# Patient Record
Sex: Male | Born: 2000 | Race: Black or African American | Hispanic: No | Marital: Single | Smoking: Former smoker
Health system: Southern US, Community
[De-identification: ages and names within clinical notes are randomized; demographics above are authoritative.]

## PROBLEM LIST (undated history)

## (undated) DIAGNOSIS — F909 Attention-deficit hyperactivity disorder, unspecified type: Secondary | ICD-10-CM

## (undated) DIAGNOSIS — F209 Schizophrenia, unspecified: Secondary | ICD-10-CM

---
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
## 2019-03-23 ENCOUNTER — Emergency Department (HOSPITAL_COMMUNITY)
Admission: EM | Admit: 2019-03-23 | Discharge: 2019-03-23 | Disposition: A | Discharge status: Home / Self Care | Payer: Self-pay | Attending: Emergency Medicine | Admitting: Emergency Medicine

## 2019-03-23 ENCOUNTER — Other Ambulatory Visit: Payer: Self-pay

## 2019-03-23 ENCOUNTER — Encounter (HOSPITAL_COMMUNITY): Payer: Self-pay | Admitting: Emergency Medicine

## 2019-03-23 DIAGNOSIS — J301 Allergic rhinitis due to pollen: Secondary | ICD-10-CM | POA: Insufficient documentation

## 2019-03-23 DIAGNOSIS — F17200 Nicotine dependence, unspecified, uncomplicated: Secondary | ICD-10-CM | POA: Insufficient documentation

## 2019-03-23 DIAGNOSIS — K120 Recurrent oral aphthae: Secondary | ICD-10-CM | POA: Insufficient documentation

## 2019-03-23 DIAGNOSIS — J302 Other seasonal allergic rhinitis: Secondary | ICD-10-CM

## 2019-03-23 DIAGNOSIS — Z79899 Other long term (current) drug therapy: Secondary | ICD-10-CM | POA: Insufficient documentation

## 2019-03-23 MED ORDER — CETIRIZINE-PSEUDOEPHEDRINE ER 5-120 MG PO TB12: 1.0000 | ORAL_TABLET | Freq: Every day | ORAL | 0 refills | Status: DC

## 2019-03-23 MED ORDER — LIDOCAINE VISCOUS HCL 2 % MT SOLN: OROMUCOSAL | 0 refills | Status: DC

## 2019-03-23 MED ORDER — FLUTICASONE PROPIONATE 50 MCG/ACT NA SUSP: 1.0000 | Freq: Every day | NASAL | 2 refills | Status: DC | PRN

## 2019-03-23 NOTE — ED Triage Notes (Signed)
Pt c/o of nasal congestion, chest congestion, productive cough and right sided tooth pain x 3 days

## 2019-03-23 NOTE — Discharge Instructions (Signed)
You may use the medications prescribed for your symptoms.  You may find that the Zyrtec-D alone alleviates your nasal congestion and drainage, but you may add the Flonase if needed for additional help with nasal congestion.  You have some small ulcers of your gums called canker sores.  These can be painful especially if you eat salty or spicy or acidic foods.  You may use the topical numbing medicine prescribed for symptom relief while these sores heal.

## 2019-03-24 NOTE — ED Provider Notes (Signed)
[facility] EMERGENCY DEPARTMENT Provider Note   CSN: [id number] Arrival date & time: 03/23/19  0964    History   Chief Complaint Chief Complaint  Patient presents with  . Nasal Congestion    HPI Omar Owen is a 18 y.o. male with no significant past medical history presenting with 2 complaints, the first being nasal congestion with clear rhinorrhea, chest congestion with cough productive of a clear sputum x 3 days.  He also reports itchy, watery eyes and episodic sneezes.  He denies fever, headache, ear or sinus pain, neck pain or stiffness, also no sob, cp or sore throat.  He also reports pain along his right lower gumline and has several sores in his mouth which sting when he eats.  He has had no treatment for his sx.  He is supposed to see his dentist this week for evaluation of molar teeth along his right lower jaw that never erupted.     The history is provided by the patient.    Past Medical History:  Diagnosis Date  . ADHD     There are no active problems to display for this patient.   History reviewed. No pertinent surgical history.      Home Medications    Prior to Admission medications   Medication Sig Start Date End Date Taking? Authorizing Provider  benzonatate (TESSALON) 100 MG capsule Take 1 capsule (100 mg total) by mouth 3 (three) times daily as needed for cough. 11/14/16   Samuel Jester, DO  cetirizine-pseudoephedrine (ZYRTEC-D) 5-120 MG tablet Take 1 tablet by mouth daily. 03/23/19   Burgess Amor, PA-C  fluticasone (FLONASE) 50 MCG/ACT nasal spray Place 1 spray into both nostrils daily as needed for allergies or rhinitis. 03/23/19   Burgess Amor, PA-C  lidocaine (XYLOCAINE) 2 % solution Apply a Q-tip swab dipped in your lidocaine the areas of ulcerations and pain in your mouth every 3 hours as needed for pain relief. 03/23/19   Burgess Amor, PA-C  ondansetron (ZOFRAN) 4 MG tablet Take 1 tablet (4 mg total) by mouth every 8 (eight) hours as needed for  nausea or vomiting. 11/14/16   Samuel Jester, DO  OVER THE COUNTER MEDICATION Take 10 mLs by mouth daily as needed (cough). OTC cough medication    [provider]    Family History History reviewed. No pertinent family history.  Social History Social History   Tobacco Use  . Smoking status: Current Every Day Smoker    Packs/day: 0.50  . Smokeless tobacco: Never Used  Substance Use Topics  . Alcohol use: No  . Drug use: No     Allergies   Patient has no known allergies.   Review of Systems Review of Systems  Constitutional: Negative for chills and fever.  HENT: Positive for congestion, dental problem and rhinorrhea. Negative for ear pain, facial swelling, sinus pressure, sore throat, trouble swallowing and voice change.   Eyes: Negative for discharge.  Respiratory: Positive for cough. Negative for shortness of breath, wheezing and stridor.   Cardiovascular: Negative for chest pain.  Gastrointestinal: Negative for abdominal pain.  Genitourinary: Negative.   Musculoskeletal: Negative for neck pain and neck stiffness.  Neurological: Negative for speech difficulty and headaches.     Physical Exam Updated Vital Signs BP 104/67   Pulse (!) 48   Temp 98.5 F (36.9 C) (Oral)   Resp 16   Ht 6\' 1"  (1.854 m)   Wt 63.5 kg   SpO2 100%   BMI 18.47 kg/m  Physical Exam Constitutional:      General: He is not in acute distress.    Appearance: He is well-developed.  HENT:     Head: Normocephalic and atraumatic.     Jaw: No trismus or swelling.     Right Ear: Tympanic membrane, ear canal and external ear normal.     Left Ear: Tympanic membrane, ear canal and external ear normal.     Nose: Mucosal edema and rhinorrhea present.     Comments: Nasal mucosa pale and boggy appearing.     Mouth/Throat:     Mouth: Oral lesions present.     Dentition: Normal dentition. No dental tenderness, dental caries or dental abscesses.     Pharynx: Uvula midline. No  oropharyngeal exudate or posterior oropharyngeal erythema.     Tonsils: No tonsillar abscesses.     Comments: Aphthous ulceration along right lower buccal surface gingiva.  2 very small ulcers also on sublingual mucosa.  Sublingual space soft.  Dentition appears healthy. Eyes:     Conjunctiva/sclera:     Right eye: Right conjunctiva is injected.     Left eye: Left conjunctiva is injected.     Comments: Mild conjunctival injection.  Neck:     Musculoskeletal: Normal range of motion and neck supple.  Cardiovascular:     Rate and Rhythm: Normal rate.     Heart sounds: Normal heart sounds.  Pulmonary:     Effort: Pulmonary effort is normal. No respiratory distress.     Breath sounds: Normal breath sounds. No wheezing, rhonchi or rales.  Abdominal:     General: There is no distension.     Palpations: Abdomen is soft.     Tenderness: There is no abdominal tenderness.  Musculoskeletal: Normal range of motion.  Lymphadenopathy:     Cervical: No cervical adenopathy.  Skin:    General: Skin is warm and dry.     Findings: No erythema or rash.  Neurological:     Mental Status: He is alert and oriented to person, place, and time.      ED Treatments / Results  Labs (all labs ordered are listed, but only abnormal results are displayed) Labs Reviewed - No data to display  EKG None  Radiology No results found.  Procedures Procedures (including critical care time)  Medications Ordered in ED Medications - No data to display   Initial Impression / Assessment and Plan / ED Course  I have reviewed the triage vital signs and the nursing notes.  Pertinent labs & imaging results that were available during my care of the patient were reviewed by me and considered in my medical decision making (see chart for details).        Pt with nasal congestion, cough, itchy eyes, exam suggesting seasonal allergy. Sx tx including zyrtec d, flonase.  Normal dentition with oral apthous ulcerations  - lidocaine topical for sx relief. Advised f/u pcp prn for persistent sx.   Final Clinical Impressions(s) / ED Diagnoses   Final diagnoses:  Seasonal allergic rhinitis, unspecified trigger  Aphthous ulcer    ED Discharge Orders         Ordered    cetirizine-pseudoephedrine (ZYRTEC-D) 5-120 MG tablet  Daily     03/23/19 1109    fluticasone (FLONASE) 50 MCG/ACT nasal spray  Daily PRN     03/23/19 1109    lidocaine (XYLOCAINE) 2 % solution     03/23/19 1112           Nithin Demeo, [place], PA-C  03/24/19 2121    Vanetta Mulders, MD 03/26/19 1929

## 2019-12-29 ENCOUNTER — Other Ambulatory Visit: Payer: Self-pay

## 2019-12-29 ENCOUNTER — Ambulatory Visit: Payer: Self-pay | Attending: Internal Medicine

## 2019-12-29 DIAGNOSIS — Z20822 Contact with and (suspected) exposure to covid-19: Secondary | ICD-10-CM

## 2019-12-29 DIAGNOSIS — U071 COVID-19: Secondary | ICD-10-CM | POA: Insufficient documentation

## 2019-12-30 LAB — NOVEL CORONAVIRUS, NAA: SARS-CoV-2, NAA: DETECTED — AB

## 2020-01-12 ENCOUNTER — Ambulatory Visit: Payer: Self-pay | Attending: Internal Medicine

## 2020-01-13 ENCOUNTER — Other Ambulatory Visit: Payer: Self-pay

## 2020-01-13 ENCOUNTER — Ambulatory Visit: Payer: Self-pay | Attending: Internal Medicine

## 2020-01-13 DIAGNOSIS — Z20822 Contact with and (suspected) exposure to covid-19: Secondary | ICD-10-CM | POA: Insufficient documentation

## 2020-01-14 LAB — NOVEL CORONAVIRUS, NAA: SARS-CoV-2, NAA: NOT DETECTED

## 2020-01-18 ENCOUNTER — Telehealth: Payer: Self-pay | Admitting: General Practice

## 2020-01-18 NOTE — Telephone Encounter (Signed)
Negative COVID results given. Patient results "NOT Detected." Caller expressed understanding. ° °

## 2020-01-20 ENCOUNTER — Telehealth: Payer: Self-pay | Admitting: Orthopedic Surgery
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
## 2020-01-24 NOTE — Telephone Encounter (Signed)
Called back to patient to follow up, as no further response received. Left another message.

## 2021-08-30 ENCOUNTER — Emergency Department (HOSPITAL_COMMUNITY)
Admission: EM | Admit: 2021-08-30 | Discharge: 2021-08-30 | Disposition: A | Discharge status: Home / Self Care | Payer: PRIVATE HEALTH INSURANCE | Attending: Emergency Medicine | Admitting: Emergency Medicine

## 2021-08-30 ENCOUNTER — Encounter (HOSPITAL_COMMUNITY): Payer: Self-pay | Admitting: Emergency Medicine

## 2021-08-30 ENCOUNTER — Other Ambulatory Visit: Payer: Self-pay

## 2021-08-30 DIAGNOSIS — F17210 Nicotine dependence, cigarettes, uncomplicated: Secondary | ICD-10-CM | POA: Insufficient documentation

## 2021-08-30 DIAGNOSIS — K0889 Other specified disorders of teeth and supporting structures: Secondary | ICD-10-CM | POA: Diagnosis not present

## 2021-08-30 DIAGNOSIS — K029 Dental caries, unspecified: Secondary | ICD-10-CM

## 2021-08-30 MED ORDER — TRAMADOL HCL 50 MG PO TABS: 50.0000 mg | ORAL_TABLET | Freq: Four times a day (QID) | ORAL | 0 refills | Status: DC | PRN

## 2021-08-30 MED ORDER — CLINDAMYCIN HCL 300 MG PO CAPS: 300.0000 mg | ORAL_CAPSULE | Freq: Four times a day (QID) | ORAL | 0 refills | Status: DC

## 2021-08-30 NOTE — Discharge Instructions (Addendum)
Begin taking clindamycin as prescribed.  Begin taking tramadol as prescribed as needed for pain.  Follow-up with your dentist in the next 3 to 4 days.

## 2021-08-30 NOTE — ED Triage Notes (Signed)
Pt c/o top right dental pain for the past 3 weeks. Pt had filling fall out, pt has attempted to get in with dentist but has been unable to.

## 2021-08-30 NOTE — ED Provider Notes (Signed)
[facility] EMERGENCY DEPARTMENT Provider Note   CSN: [id number] Arrival date & time: 08/30/21  0001     History Chief Complaint  Patient presents with   Dental Pain    Omar Owen is a 20 y.o. male.  Patient is a 20 year old male with history of ADHD.  He presents with dental pain.  He describes a filling following out of his right upper bicuspid.  He is describing sharp pain ever since.  The pain is worse with eating or drinking.  The history is provided by the patient.  Dental Pain Location:  Upper Upper teeth location:  6/RU cuspid Quality:  Throbbing Severity:  Moderate Onset quality:  Sudden Duration:  3 days Timing:  Constant Progression:  Worsening Chronicity:  New Context: filling fell out   Relieved by:  Nothing Worsened by:  Cold food/drink and hot food/drink     Past Medical History:  Diagnosis Date   ADHD     There are no problems to display for this patient.   History reviewed. No pertinent surgical history.     History reviewed. No pertinent family history.  Social History   Tobacco Use   Smoking status: Every Day    Packs/day: 0.50    Types: Cigarettes   Smokeless tobacco: Never  Vaping Use   Vaping Use: Some days  Substance Use Topics   Alcohol use: No   Drug use: No    Home Medications Prior to Admission medications   Medication Sig Start Date End Date Taking? Authorizing Provider  benzonatate (TESSALON) 100 MG capsule Take 1 capsule (100 mg total) by mouth 3 (three) times daily as needed for cough. 11/14/16   Samuel Jester, DO  cetirizine-pseudoephedrine (ZYRTEC-D) 5-120 MG tablet Take 1 tablet by mouth daily. 03/23/19   Burgess Amor, PA-C  fluticasone (FLONASE) 50 MCG/ACT nasal spray Place 1 spray into both nostrils daily as needed for allergies or rhinitis. 03/23/19   Burgess Amor, PA-C  lidocaine (XYLOCAINE) 2 % solution Apply a Q-tip swab dipped in your lidocaine the areas of ulcerations and pain in your mouth every 3 hours  as needed for pain relief. 03/23/19   Burgess Amor, PA-C  ondansetron (ZOFRAN) 4 MG tablet Take 1 tablet (4 mg total) by mouth every 8 (eight) hours as needed for nausea or vomiting. 11/14/16   Samuel Jester, DO  OVER THE COUNTER MEDICATION Take 10 mLs by mouth daily as needed (cough). OTC cough medication    [provider]    Allergies    Patient has no known allergies.  Review of Systems   Review of Systems  All other systems reviewed and are negative.  Physical Exam Updated Vital Signs BP 112/71 (BP Location: Right Arm)   Pulse 67   Temp 98 F (36.7 C) (Oral)   Resp 17   Ht 6\' 1"  (1.854 m)   Wt 63.5 kg   SpO2 100%   BMI 18.47 kg/m   Physical Exam Vitals and nursing note reviewed.  Constitutional:      General: He is not in acute distress.    Appearance: Normal appearance. He is not ill-appearing.  HENT:     Head: Normocephalic and atraumatic.     Mouth/Throat:     Comments: The right upper bicuspid have decay.  There is surrounding gingival inflammation, but no frank abscess. Pulmonary:     Effort: Pulmonary effort is normal.  Skin:    General: Skin is warm and dry.  Neurological:  Mental Status: He is alert.    ED Results / Procedures / Treatments   Labs (all labs ordered are listed, but only abnormal results are displayed) Labs Reviewed - No data to display  EKG None  Radiology No results found.  Procedures Procedures   Medications Ordered in ED Medications - No data to display  ED Course  I have reviewed the triage vital signs and the nursing notes.  Pertinent labs & imaging results that were available during my care of the patient were reviewed by me and considered in my medical decision making (see chart for details).    MDM Rules/Calculators/A&P  Patient with dental pain after a filling fell out.  He will be treated with antibiotics, tramadol, and follow-up with dentistry.  Final Clinical Impression(s) / ED Diagnoses Final  diagnoses:  None    Rx / DC Orders ED Discharge Orders     None        Geoffery Lyons, MD 08/30/21 [phone]

## 2023-06-06 ENCOUNTER — Emergency Department (HOSPITAL_COMMUNITY)
Admission: EM | Admit: 2023-06-06 | Discharge: 2023-06-07 | Disposition: A | Discharge status: Home / Self Care | Payer: PRIVATE HEALTH INSURANCE | Attending: Emergency Medicine | Admitting: Emergency Medicine

## 2023-06-06 ENCOUNTER — Other Ambulatory Visit: Payer: Self-pay

## 2023-06-06 DIAGNOSIS — F17210 Nicotine dependence, cigarettes, uncomplicated: Secondary | ICD-10-CM | POA: Insufficient documentation

## 2023-06-06 DIAGNOSIS — F29 Unspecified psychosis not due to a substance or known physiological condition: Secondary | ICD-10-CM | POA: Diagnosis present

## 2023-06-06 LAB — RAPID URINE DRUG SCREEN, HOSP PERFORMED
Amphetamines: NOT DETECTED
Barbiturates: NOT DETECTED
Benzodiazepines: NOT DETECTED
Cocaine: NOT DETECTED
Opiates: NOT DETECTED
Tetrahydrocannabinol: POSITIVE — AB

## 2023-06-06 LAB — CBC
HCT: 40.3 % (ref 39.0–52.0)
Hemoglobin: 14 g/dL (ref 13.0–17.0)
MCH: 30.7 pg (ref 26.0–34.0)
MCHC: 34.7 g/dL (ref 30.0–36.0)
MCV: 88.4 fL (ref 80.0–100.0)
Platelets: 242 10*3/uL (ref 150–400)
RBC: 4.56 MIL/uL (ref 4.22–5.81)
RDW: 12.9 % (ref 11.5–15.5)
WBC: 11.1 10*3/uL — ABNORMAL HIGH (ref 4.0–10.5)
nRBC: 0 % (ref 0.0–0.2)

## 2023-06-06 LAB — COMPREHENSIVE METABOLIC PANEL
ALT: 13 U/L (ref 0–44)
AST: 18 U/L (ref 15–41)
Albumin: 4.2 g/dL (ref 3.5–5.0)
Alkaline Phosphatase: 50 U/L (ref 38–126)
Anion gap: 10 (ref 5–15)
BUN: 9 mg/dL (ref 6–20)
CO2: 25 mmol/L (ref 22–32)
Calcium: 9 mg/dL (ref 8.9–10.3)
Chloride: 102 mmol/L (ref 98–111)
Creatinine, Ser: 0.93 mg/dL (ref 0.61–1.24)
GFR, Estimated: >60 mL/min (ref >60)
Glucose, Bld: 97 mg/dL (ref 70–99)
Potassium: 3.9 mmol/L (ref 3.5–5.1)
Sodium: 137 mmol/L (ref 135–145)
Total Bilirubin: 0.9 mg/dL (ref 0.3–1.2)
Total Protein: 7 g/dL (ref 6.5–8.1)

## 2023-06-06 LAB — ETHANOL: Alcohol, Ethyl (B): <10 mg/dL (ref <10)

## 2023-06-06 NOTE — ED Provider Notes (Signed)
11:41 PM Assumed care from Dr. Rubin Payor, please see their note for full history, physical and decision making until this point. In brief this is a 22 y.o. year old male who presented to the ED tonight with IVC     Here with possible new onset psychosis. Pending medical clearance for TTS consultation. Will need lab FU.   Labs reassuring. TTS consult ordered.   Labs, studies and imaging reviewed by myself and considered in medical decision making if ordered. Imaging interpreted by radiology.  Labs Reviewed  CBC - Abnormal; Notable for the following components:      Result Value   WBC 11.1 (*)    All other components within normal limits  RAPID URINE DRUG SCREEN, HOSP PERFORMED - Abnormal; Notable for the following components:   Tetrahydrocannabinol POSITIVE (*)    All other components within normal limits  COMPREHENSIVE METABOLIC PANEL  ETHANOL    No orders to display    No follow-ups on file.    Marily Memos, MD 06/06/23 [phone]

## 2023-06-06 NOTE — ED Notes (Signed)
Pt has been dressed out and put in scrubs. Pts belongings are placed in locker 10. Security has wanded pt. Gave a sandwich bag and apple juice.
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
## 2023-06-07 ENCOUNTER — Inpatient Hospital Stay (HOSPITAL_COMMUNITY)
Admission: EM | Admit: 2023-06-07 | Discharge: 2023-06-12 | DRG: 897 | Disposition: A | Discharge status: Home / Self Care | Payer: PRIVATE HEALTH INSURANCE | Source: Intra-hospital | Attending: Psychiatry | Admitting: Psychiatry

## 2023-06-07 ENCOUNTER — Encounter (HOSPITAL_COMMUNITY): Payer: Self-pay | Admitting: Psychiatry

## 2023-06-07 DIAGNOSIS — Z818 Family history of other mental and behavioral disorders: Secondary | ICD-10-CM

## 2023-06-07 DIAGNOSIS — R45851 Suicidal ideations: Secondary | ICD-10-CM | POA: Diagnosis present

## 2023-06-07 DIAGNOSIS — F19959 Other psychoactive substance use, unspecified with psychoactive substance-induced psychotic disorder, unspecified: Principal | ICD-10-CM | POA: Diagnosis present

## 2023-06-07 DIAGNOSIS — F4010 Social phobia, unspecified: Secondary | ICD-10-CM | POA: Diagnosis present

## 2023-06-07 DIAGNOSIS — F32A Depression, unspecified: Secondary | ICD-10-CM | POA: Diagnosis present

## 2023-06-07 DIAGNOSIS — F17210 Nicotine dependence, cigarettes, uncomplicated: Secondary | ICD-10-CM | POA: Diagnosis present

## 2023-06-07 DIAGNOSIS — F29 Unspecified psychosis not due to a substance or known physiological condition: Secondary | ICD-10-CM

## 2023-06-07 MED ORDER — LORAZEPAM 1 MG PO TABS: 2.0000 mg | ORAL_TABLET | Freq: Three times a day (TID) | ORAL | Status: DC | PRN

## 2023-06-07 MED ORDER — HYDROXYZINE HCL 25 MG PO TABS: 50.0000 mg | ORAL_TABLET | Freq: Three times a day (TID) | ORAL | Status: DC | PRN

## 2023-06-07 MED ORDER — MAGNESIUM HYDROXIDE 400 MG/5ML PO SUSP: 30.0000 mL | Freq: Every day | ORAL | Status: DC | PRN

## 2023-06-07 MED ORDER — OLANZAPINE 10 MG IM SOLR: 10.0000 mg | Freq: Once | INTRAMUSCULAR | Status: DC | PRN

## 2023-06-07 MED ORDER — HALOPERIDOL 5 MG PO TABS: 5.0000 mg | ORAL_TABLET | Freq: Three times a day (TID) | ORAL | Status: DC | PRN

## 2023-06-07 MED ORDER — DIPHENHYDRAMINE HCL 50 MG/ML IJ SOLN
50.0000 mg | Freq: Three times a day (TID) | INTRAMUSCULAR | Status: DC | PRN
  Administered 2023-06-07: 50 mg via INTRAMUSCULAR
  Filled 2023-06-07: qty 1

## 2023-06-07 MED ORDER — OLANZAPINE 5 MG PO TBDP: 5.0000 mg | ORAL_TABLET | Freq: Three times a day (TID) | ORAL | Status: DC | PRN

## 2023-06-07 MED ORDER — DIPHENHYDRAMINE HCL 25 MG PO CAPS: 50.0000 mg | ORAL_CAPSULE | Freq: Three times a day (TID) | ORAL | Status: DC | PRN

## 2023-06-07 MED ORDER — LORAZEPAM 2 MG/ML IJ SOLN
2.0000 mg | Freq: Three times a day (TID) | INTRAMUSCULAR | Status: DC | PRN
  Administered 2023-06-07: 2 mg via INTRAMUSCULAR
  Filled 2023-06-07: qty 1

## 2023-06-07 MED ORDER — HALOPERIDOL LACTATE 5 MG/ML IJ SOLN
5.0000 mg | Freq: Three times a day (TID) | INTRAMUSCULAR | Status: DC | PRN
  Administered 2023-06-07: 5 mg via INTRAMUSCULAR
  Filled 2023-06-07: qty 1

## 2023-06-07 MED ORDER — ALUM & MAG HYDROXIDE-SIMETH 200-200-20 MG/5ML PO SUSP: 30.0000 mL | ORAL | Status: DC | PRN

## 2023-06-07 MED ORDER — OLANZAPINE 10 MG PO TBDP
10.0000 mg | ORAL_TABLET | Freq: Every day | ORAL | Status: DC
  Administered 2023-06-08 (×2): 10 mg via ORAL
  Filled 2023-06-07 (×4): qty 1

## 2023-06-07 MED ORDER — RISPERIDONE 1 MG PO TBDP: 1.0000 mg | ORAL_TABLET | Freq: Two times a day (BID) | ORAL | Status: DC

## 2023-06-07 MED ORDER — HYDROXYZINE HCL 25 MG PO TABS
25.0000 mg | ORAL_TABLET | Freq: Three times a day (TID) | ORAL | Status: DC | PRN
  Filled 2023-06-07: qty 1

## 2023-06-07 MED ORDER — ACETAMINOPHEN 325 MG PO TABS: 650.0000 mg | ORAL_TABLET | Freq: Four times a day (QID) | ORAL | Status: DC | PRN

## 2023-06-07 MED ORDER — TRAZODONE HCL 50 MG PO TABS
50.0000 mg | ORAL_TABLET | Freq: Every evening | ORAL | Status: DC | PRN
  Administered 2023-06-08 – 2023-06-09 (×2): 50 mg via ORAL
  Filled 2023-06-07 (×3): qty 1
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
## 2023-06-07 NOTE — ED Provider Notes (Addendum)
Discussed with telepsych. Suspects new onset psychosis and recommending inpatient. Bed search initiated.    Shayonna Ocampo, Barbara Cower, MD 06/07/23 [phone]  Patient accepted to [facility]. [place] accepting.    Jadee Golebiewski, Barbara Cower, MD 06/07/23 [phone]
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
## 2023-06-07 NOTE — Tx Team (Signed)
Initial Treatment Plan 06/07/2023 12:22 PM Omar Owen ZOX:[id number]    PATIENT STRESSORS: Legal issue   Marital or family conflict   Substance abuse     PATIENT STRENGTHS: Physical Health  Work skills    PATIENT IDENTIFIED PROBLEMS: "Get out of here"  Angry  Ineffective coping skills  Anxiety               DISCHARGE CRITERIA:  Ability to meet basic life and health needs Adequate post-discharge living arrangements  PRELIMINARY DISCHARGE PLAN: Attend aftercare/continuing care group Outpatient therapy Return to previous living arrangement  PATIENT/FAMILY INVOLVEMENT: This treatment plan has been presented to and reviewed with the patient, Omar Owen, and/or family member.  The patient and family have been given the opportunity to ask questions and make suggestions.  Clarene Critchley, RN 06/07/2023, 12:22 PM

## 2023-06-07 NOTE — Group Note (Signed)
Date:  06/07/2023 Time:  8:39 PM  Group Topic/Focus:  Wrap-Up Group:   The focus of this group is to help patients review their daily goal of treatment and discuss progress on daily workbooks.    Participation Level:  Did Not Attend   Scot Dock 06/07/2023, 8:39 PM
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
## 2023-06-07 NOTE — ED Provider Notes (Signed)
Emergency Medicine Observation Re-evaluation Note  Omar Owen is a 22 y.o. male, seen on rounds today.  Pt initially presented to the ED for complaints of IVC Currently, the patient is sleeping, easily arousable.  Physical Exam  BP 102/66 (BP Location: Right Arm)   Pulse 94   Temp 98.2 F (36.8 C) (Oral)   Resp 18   Ht 6\' 1"  (1.854 m)   Wt 63.5 kg   SpO2 96%   BMI 18.47 kg/m  Physical Exam General: alert after waking up Cardiac: no m/r/g Lungs: clear Psych: paranoid, delusional  ED Course / MDM  EKG:EKG Interpretation  Date/Time:  Friday June 06 2023 22:15:51 EDT Ventricular Rate:  94 PR Interval:  152 QRS Duration: 90 QT Interval:  332 QTC Calculation: 415 R Axis:   70 Text Interpretation: Normal sinus rhythm Right atrial enlargement Borderline ECG No previous ECGs available Confirmed by Marily Memos [id number]) on 06/06/2023 11:44:21 PM  I have reviewed the labs performed to date as well as medications administered while in observation.  Recent changes in the last 24 hours include accepted to [facility], [place], pending transport.  Plan  Current plan is for accepted to [facility], [place], pending transport.Marily Memos, MD 06/07/23 [phone]

## 2023-06-07 NOTE — Progress Notes (Signed)
   06/07/23 1945  Psych Admission Type (Psych Patients Only)  Admission Status Involuntary  Psychosocial Assessment  Patient Complaints Anger;Irritability  Eye Contact Brief  Facial Expression Angry  Affect Labile  Speech Soft  Interaction Minimal  Motor Activity Slow  Appearance/Hygiene In scrubs  Behavior Characteristics Agitated  Mood Angry  Aggressive Behavior  Effect No apparent injury  Thought Process  Coherency Circumstantial  Content Blaming others  Delusions None reported or observed  Perception WDL  Hallucination None reported or observed  Judgment Impaired  Confusion None  Danger to Self  Current suicidal ideation? Denies  Danger to Others  Danger to Others Reported or observed  Danger to Others Abnormal  Harmful Behavior to others Threats of violence towards other people observed or expressed   Destructive Behavior No threats or harm toward property

## 2023-06-07 NOTE — ED Notes (Signed)
Patient escorted with Omar Owen Dept to [facility], all of patient's belonging sent with him.

## 2023-06-07 NOTE — Progress Notes (Signed)
Pt became verbally and physically aggressive in milieu in his protest, demands for discharge when educated on IVC commitment by Clinical research associate. Per pt "it's a fucking woman's thing, My family lied on me, everybody playing games with me. I don't fucking belong here and I need to leave". Escalated to slamming his room door, throwing his hygiene products (shampoo, lotion, etc) aggressively from his room door to unit entrance door. Verbal redirections were ineffective at the time as pt continued to escalate in his aggression. PRN agitation protocol (See EMAR) administered at 1248. Pt asleep at 1345 when reassessed. Respirations noted and unlabored. Safety checks maintained at Q 15 minutes intervals.

## 2023-06-07 NOTE — ED Notes (Signed)
TTS completed. 

## 2023-06-07 NOTE — ED Notes (Addendum)
TTS evaluation set for 0415.
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
## 2023-06-07 NOTE — BH Assessment (Signed)
Dr. Gilman Schmidt will see pt at 4:15 am.

## 2023-06-08 MED ORDER — NICOTINE POLACRILEX 2 MG MT GUM
2.0000 mg | CHEWING_GUM | OROMUCOSAL | Status: DC | PRN
  Administered 2023-06-08 – 2023-06-12 (×16): 2 mg via ORAL
  Filled 2023-06-08 (×6): qty 1

## 2023-06-08 NOTE — Plan of Care (Signed)
  Problem: Education: Goal: Emotional status will improve Outcome: Progressing Goal: Mental status will improve Outcome: Progressing   Problem: Health Behavior/Discharge Planning: Goal: Compliance with treatment plan for underlying cause of condition will improve Outcome: Progressing   

## 2023-06-08 NOTE — Group Note (Signed)
Date:  06/08/2023 Time:  8:54 PM  Group Topic/Focus:  Wrap-Up Group:   The focus of this group is to help patients review their daily goal of treatment and discuss progress on daily workbooks.    Participation Level:  Active  Participation Quality:  Appropriate  Affect:  Appropriate  Cognitive:  Appropriate  Insight: Appropriate  Engagement in Group:  Engaged  Modes of Intervention:  Education and Exploration  Additional Comments:  Patient attended and participated in group tonight. He report that the most significant thing that happen today for him was he woke up and he is at peace with himself.  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  Lita Mains [facility] 06/08/2023, 8:54 PM

## 2023-06-08 NOTE — Group Note (Signed)
Date:  06/08/2023 Time:  9:45 AM  Group Topic/Focus:  Goals Group:   The focus of this group is to help patients establish daily goals to achieve during treatment and discuss how the patient can incorporate goal setting into their daily lives to aide in recovery.    Participation Level:  Did Not Attend    Donell Beers 06/08/2023, 9:45 AM
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
## 2023-06-08 NOTE — Progress Notes (Signed)
   06/08/23 1935  Psych Admission Type (Psych Patients Only)  Admission Status Involuntary  Psychosocial Assessment  Patient Complaints Anxiety  Eye Contact Fair  Facial Expression Animated  Affect Appropriate to circumstance  Speech Soft  Interaction Assertive  Motor Activity Other (Comment) (WDL)  Appearance/Hygiene In scrubs  Behavior Characteristics Cooperative;Anxious  Mood Anxious;Pleasant  Thought Process  Coherency Circumstantial  Content Blaming others  Delusions None reported or observed  Perception WDL  Hallucination None reported or observed  Judgment Impaired  Confusion None  Danger to Self  Current suicidal ideation? Denies  Danger to Others  Danger to Others None reported or observed  Danger to Others Abnormal  Harmful Behavior to others No threats or harm toward other people  Destructive Behavior No threats or harm toward property
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
## 2023-06-08 NOTE — Group Note (Unsigned)
Date:  06/08/2023 Time:  9:07 PM  Group Topic/Focus:  Wrap-Up Group:   The focus of this group is to help patients review their daily goal of treatment and discuss progress on daily workbooks.     Participation Level:  {BHH PARTICIPATION ONGEX:52841}  Participation Quality:  {BHH PARTICIPATION QUALITY:22265}  Affect:  {BHH AFFECT:22266}  Cognitive:  {BHH COGNITIVE:22267}  Insight: {BHH Insight2:20797}  Engagement in Group:  {BHH ENGAGEMENT IN LKGMW:10272}  Modes of Intervention:  {BHH MODES OF INTERVENTION:22269}  Additional Comments:  ***  Scot Dock 06/08/2023, 9:07 PM

## 2023-06-08 NOTE — BHH Counselor (Signed)
Attempted to complete PAS with pt however, pt sleeping and could not be woken. TOC will attempt to complete PSA at a later time.    Ruthann Cancer MSW, LCSW Environmental manager

## 2023-06-09 ENCOUNTER — Encounter (HOSPITAL_COMMUNITY): Payer: Self-pay

## 2023-06-09 DIAGNOSIS — F19959 Other psychoactive substance use, unspecified with psychoactive substance-induced psychotic disorder, unspecified: Principal | ICD-10-CM

## 2023-06-09 MED ORDER — SERTRALINE HCL 50 MG PO TABS
50.0000 mg | ORAL_TABLET | Freq: Every day | ORAL | Status: DC
  Administered 2023-06-09 – 2023-06-12 (×4): 50 mg via ORAL
  Filled 2023-06-09 (×7): qty 1

## 2023-06-09 MED ORDER — OLANZAPINE 5 MG PO TBDP
5.0000 mg | ORAL_TABLET | Freq: Every day | ORAL | Status: DC
  Administered 2023-06-09 – 2023-06-11 (×3): 5 mg via ORAL
  Filled 2023-06-09 (×6): qty 1

## 2023-06-09 NOTE — BH IP Treatment Plan (Signed)
Interdisciplinary Treatment and Diagnostic Plan Update  06/09/2023 Time of Session: 10:40am Omar Owen MRN: [id number]  Principal Diagnosis: Psychoactive substance-induced psychosis (HCC)  Secondary Diagnoses: Principal Problem:   Psychoactive substance-induced psychosis (HCC)   Current Medications:  Current Facility-Administered Medications  Medication Dose Route Frequency Provider Last Rate Last Admin   acetaminophen (TYLENOL) tablet 650 mg  650 mg Oral Q6H PRN Ajibola, Ene A, NP       alum & mag hydroxide-simeth (MAALOX/MYLANTA) 200-200-20 MG/5ML suspension 30 mL  30 mL Oral Q4H PRN Ajibola, Ene A, NP       diphenhydrAMINE (BENADRYL) capsule 50 mg  50 mg Oral TID PRN Ajibola, Ene A, NP       Or   diphenhydrAMINE (BENADRYL) injection 50 mg  50 mg Intramuscular TID PRN Ajibola, Ene A, NP   50 mg at 06/07/23 1248   haloperidol (HALDOL) tablet 5 mg  5 mg Oral TID PRN Ajibola, Ene A, NP       Or   haloperidol lactate (HALDOL) injection 5 mg  5 mg Intramuscular TID PRN Ajibola, Ene A, NP   5 mg at 06/07/23 1247   hydrOXYzine (ATARAX) tablet 25 mg  25 mg Oral TID PRN Ajibola, Ene A, NP       LORazepam (ATIVAN) tablet 2 mg  2 mg Oral TID PRN Ajibola, Ene A, NP       Or   LORazepam (ATIVAN) injection 2 mg  2 mg Intramuscular TID PRN Ajibola, Ene A, NP   2 mg at 06/07/23 1248   magnesium hydroxide (MILK OF MAGNESIA) suspension 30 mL  30 mL Oral Daily PRN Ajibola, Ene A, NP       nicotine polacrilex (NICORETTE) gum 2 mg  2 mg Oral PRN Starleen Blue, NP   2 mg at 06/09/23 1454   OLANZapine zydis (ZYPREXA) disintegrating tablet 5 mg  5 mg Oral QHS Goli, Veeraindar, MD       sertraline (ZOLOFT) tablet 50 mg  50 mg Oral Daily Rex Kras, MD   50 mg at 06/09/23 1307   traZODone (DESYREL) tablet 50 mg  50 mg Oral QHS PRN Ajibola, Ene A, NP   50 mg at 06/08/23 2040   PTA Medications: No medications prior to admission.    Patient Stressors: Legal issue   Marital or family conflict    Substance abuse    Patient Strengths: Physical Health  Work skills   Treatment Modalities: Medication Management, Group therapy, Case management,  1 to 1 session with clinician, Psychoeducation, Recreational therapy.   Physician Treatment Plan for Primary Diagnosis: Psychoactive substance-induced psychosis (HCC) Long Term Goal(s): Improvement in symptoms so as ready for discharge   Short Term Goals: Ability to disclose and discuss suicidal ideas Ability to demonstrate self-control will improve Ability to identify and develop effective coping behaviors will improve Compliance with prescribed medications will improve  Medication Management: Evaluate patient's response, side effects, and tolerance of medication regimen.  Therapeutic Interventions: 1 to 1 sessions, Unit Group sessions and Medication administration.  Evaluation of Outcomes: Progressing  Physician Treatment Plan for Secondary Diagnosis: Principal Problem:   Psychoactive substance-induced psychosis (HCC)  Long Term Goal(s): Improvement in symptoms so as ready for discharge   Short Term Goals: Ability to disclose and discuss suicidal ideas Ability to demonstrate self-control will improve Ability to identify and develop effective coping behaviors will improve Compliance with prescribed medications will improve     Medication Management: Evaluate patient's response, side effects, and tolerance of medication  regimen.  Therapeutic Interventions: 1 to 1 sessions, Unit Group sessions and Medication administration.  Evaluation of Outcomes: Progressing   RN Treatment Plan for Primary Diagnosis: Psychoactive substance-induced psychosis (HCC) Long Term Goal(s): Knowledge of disease and therapeutic regimen to maintain health will improve  Short Term Goals: Ability to remain free from injury will improve, Ability to verbalize frustration and anger appropriately will improve, Ability to demonstrate self-control, Ability to  participate in decision making will improve, Ability to verbalize feelings will improve, Ability to disclose and discuss suicidal ideas, Ability to identify and develop effective coping behaviors will improve, and Compliance with prescribed medications will improve  Medication Management: RN will administer medications as ordered by provider, will assess and evaluate patient's response and provide education to patient for prescribed medication. RN will report any adverse and/or side effects to prescribing provider.  Therapeutic Interventions: 1 on 1 counseling sessions, Psychoeducation, Medication administration, Evaluate responses to treatment, Monitor vital signs and CBGs as ordered, Perform/monitor CIWA, COWS, AIMS and Fall Risk screenings as ordered, Perform wound care treatments as ordered.  Evaluation of Outcomes: Progressing   LCSW Treatment Plan for Primary Diagnosis: Psychoactive substance-induced psychosis (HCC) Long Term Goal(s): Safe transition to appropriate next level of care at discharge, Engage patient in therapeutic group addressing interpersonal concerns.  Short Term Goals: Engage patient in aftercare planning with referrals and resources, Increase social support, Increase ability to appropriately verbalize feelings, Increase emotional regulation, Facilitate acceptance of mental health diagnosis and concerns, Facilitate patient progression through stages of change regarding substance use diagnoses and concerns, Identify triggers associated with mental health/substance abuse issues, and Increase skills for wellness and recovery  Therapeutic Interventions: Assess for all discharge needs, 1 to 1 time with Social worker, Explore available resources and support systems, Assess for adequacy in community support network, Educate family and significant other(s) on suicide prevention, Complete Psychosocial Assessment, Interpersonal group therapy.  Evaluation of Outcomes:  Progressing   Progress in Treatment: Attending groups: Yes. Participating in groups: Yes. Taking medication as prescribed: Yes. Toleration medication: Yes. Family/Significant other contact made: No, will contact:  patient declines  Patient understands diagnosis: Yes. Discussing patient identified problems/goals with staff: Yes. Medical problems stabilized or resolved: Yes. Denies suicidal/homicidal ideation: Yes. Issues/concerns per patient self-inventory: No.   New problem(s) identified: No, Describe:  none reported   New Short Term/Long Term Goal(s): detox, medication management for mood stabilization; elimination of SI thoughts; development of comprehensive mental wellness/sobriety plan      Patient Goals:  Pt states, "I want to maintain my anger"  Discharge Plan or Barriers:  Patient recently admitted. CSW will continue to follow and assess for appropriate referrals and possible discharge planning.   Reason for Continuation of Hospitalization: Aggression Anxiety Depression Mania Medication stabilization  Estimated Length of Stay: 3-5 days  Last 3 [place] Suicide Severity Risk Score: Flowsheet Row Ad[facility] (Current) [facility] from 06/06/2023 in [facility] Department at [facility] ED from 08/30/2021 in [facility] Emergency Department at [facility]  C-SSRS RISK CATEGORY No Risk No Risk No Risk       Last PHQ 2/9 Scores:     No data to display          Scribe for Treatment Team: Beatris Si, LCSW 06/09/2023 4:06 PM
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
## 2023-06-09 NOTE — Progress Notes (Signed)
   06/09/23 0906  Psych Admission Type (Psych Patients Only)  Admission Status Involuntary  Psychosocial Assessment  Patient Complaints None  Eye Contact Fair  Facial Expression Animated  Affect Appropriate to circumstance  Speech Logical/coherent  Interaction Assertive  Motor Activity Other (Comment) (WNL)  Appearance/Hygiene Unremarkable  Behavior Characteristics Cooperative;Appropriate to situation  Mood Pleasant  Thought Process  Coherency Circumstantial  Content Blaming others  Delusions None reported or observed  Perception WDL  Hallucination None reported or observed  Judgment Impaired  Confusion None  Danger to Self  Current suicidal ideation? Denies  Danger to Others  Danger to Others None reported or observed  Danger to Others Abnormal  Harmful Behavior to others No threats or harm toward other people  Destructive Behavior No threats or harm toward property

## 2023-06-09 NOTE — Group Note (Signed)
LCSW Group Therapy Note  Group Date: 06/09/2023 Start Time: 1300 End Time: 1400   Type of Therapy and Topic:  Group Therapy - How To Cope with Nervousness about Discharge   Participation Level:  Active   Description of Group This process group involved identification of patients' feelings about discharge. Some of them are scheduled to be discharged soon, while others are new admissions, but each of them was asked to share thoughts and feelings surrounding discharge from the hospital. One common theme was that they are excited at the prospect of going home, while another was that many of them are apprehensive about sharing why they were hospitalized. Patients were given the opportunity to discuss these feelings with their peers in preparation for discharge.  Therapeutic Goals  Patient will identify their overall feelings about pending discharge. Patient will think about how they might proactively address issues that they believe will once again arise once they get home (i.e. with parents). Patients will participate in discussion about having hope for change.   Summary of Patient Progress:  Omar Owen was very active throughout the session. He demonstrated good insight into the subject matter, and proved open to input from peers and feedback from CSW. He was respectful of peers and participated throughout the entire session.   Therapeutic Modalities Cognitive Behavioral Therapy   Beatris Si, LCSW 06/09/2023  3:42 PM

## 2023-06-09 NOTE — Plan of Care (Signed)
°  Problem: Education: °Goal: Emotional status will improve °Outcome: Progressing °Goal: Mental status will improve °Outcome: Progressing °  °Problem: Activity: °Goal: Interest or engagement in activities will improve °Outcome: Progressing °  °
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
## 2023-06-09 NOTE — Progress Notes (Signed)
Recreation Therapy Notes  INPATIENT RECREATION THERAPY ASSESSMENT  Patient Details Name: Omar Owen MRN: [id number] DOB: 2001-08-11 Today's Date: 06/09/2023       Information Obtained From: Patient  Able to Participate in Assessment/Interview: Yes  Patient Presentation: Alert  Reason for Admission (Per Patient): Other (Comments) ("family problems")  Patient Stressors: Family  Coping Skills:   Film/video editor, Sports, TV, Music, Exercise, Deep Breathing, Meditate, Talk, Art, Prayer, Avoidance, Read, Hot Bath/Shower  Leisure Interests (2+):  Nature - Other (Comment), Individual - Other (Comment) (Party/Have fun; Environmental education officer)  Frequency of Recreation/Participation: Other (Comment) (Weekends)  Awareness of Community Resources:  Yes  Community Resources:  [employer], Public affairs consultant, Research scientist (physical sciences)  Current Use: Yes  If no, Barriers?:    Expressed Interest in [employer] Information: No  [employer] of Residence:  [place]  Patient Main Form of Transportation: Set designer  Patient Strengths:  Working out, good at finding missing pieces to things like puzzles  Patient Identified Areas of Improvement:  "No"  Patient Goal for Hospitalization:  "learn how to cope with myself and not worry about everyone else"  Current SI (including self-harm):  No  Current HI:  No  Current AVH: No  Staff Intervention Plan: Group Attendance, Collaborate with Interdisciplinary Treatment Team  Consent to Intern Participation: N/A   Liv Rallis-McCall, LRT,CTRS Ece [place] A Kaide Gage-McCall 06/09/2023, 1:43 PM

## 2023-06-09 NOTE — Group Note (Signed)
Date:  06/09/2023 Time:  9:00 PM  Group Topic/Focus:  Wrap-Up Group:   The focus of this group is to help patients review their daily goal of treatment and discuss progress on daily workbooks.    Participation Level:  Active  Participation Quality:  Appropriate  Affect:  Appropriate  Cognitive:  Appropriate  Insight: Appropriate  Engagement in Group:  Engaged  Modes of Intervention:  Education and Exploration  Additional Comments:  Patient attended and participated in group tonight. He reports that today he got to speak with his family which was important to him.   Lita Mains [facility] 06/09/2023, 9:00 PM

## 2023-06-09 NOTE — Progress Notes (Signed)
   06/09/23 2040  Psych Admission Type (Psych Patients Only)  Admission Status Involuntary  Psychosocial Assessment  Patient Complaints Restlessness (Pt reported that he became restless around 0200 and 0300 lastnight, "jerking and not sure if it was related to medication.")  Eye Contact Fair  Facial Expression Animated  Affect Appropriate to circumstance  Speech Logical/coherent  Interaction Assertive  Motor Activity Other (Comment) (WDL)  Appearance/Hygiene Unremarkable  Behavior Characteristics Cooperative;Appropriate to situation  Mood Pleasant  Thought Process  Coherency Circumstantial  Content Blaming others  Delusions None reported or observed  Perception WDL  Hallucination None reported or observed  Judgment Impaired  Confusion None  Danger to Self  Current suicidal ideation? Denies  Danger to Others  Danger to Others None reported or observed  Danger to Others Abnormal  Harmful Behavior to others No threats or harm toward other people  Destructive Behavior No threats or harm toward property

## 2023-06-09 NOTE — Progress Notes (Signed)
   06/09/23 0550  15 Minute Checks  Location Bedroom  Visual Appearance Calm  Behavior Sleeping  Sleep (Behavioral Health Patients Only)  Calculate sleep? (Click Yes once per 24 hr at 0600 safety check) Yes  Documented sleep last 24 hours 6

## 2023-06-09 NOTE — Plan of Care (Signed)
  Problem: Activity: Goal: Interest or engagement in activities will improve Outcome: Progressing   Problem: Coping: Goal: Ability to verbalize frustrations and anger appropriately will improve Outcome: Progressing   Problem: Safety: Goal: Periods of time without injury will increase Outcome: Progressing   Problem: Role Relationship: Goal: Ability to interact with others will improve Outcome: Progressing

## 2023-06-09 NOTE — [facility] Suicide Risk Assessment (Signed)
[facility] INPATIENT:  Family/Significant Other Suicide Prevention Education  Suicide Prevention Education:  Patient Refusal for Family/Significant Other Suicide Prevention Education: The patient Omar Owen has refused to provide written consent for family/significant other to be provided Family/Significant Other Suicide Prevention Education during admission and/or prior to discharge.  Physician notified.  Corky Crafts 06/09/2023, 10:51 AM

## 2023-06-09 NOTE — Group Note (Signed)
Recreation Therapy Group Note   Group Topic:Coping Skills  Group Date: 06/09/2023 Start Time: 1020 End Time: 1057 Facilitators: Cashay Manganelli-McCall, LRT,CTRS Location: [address] Dayroom   Goal Area(s) Addresses:  Patient will identify positive coping skill techniques. Patient will identify benefits of using coping skills post d/c.   Group Description:  Mind Map.  Patient was provided a blank template of a diagram with 32 blank boxes in a tiered system, branching from the center (similar to a bubble chart). LRT directed patients to label the middle of the diagram "Coping Skills" and consider 8 different sources of stress in their day to day life. LRT and patients filled in the 2nd tier of boxes with those incidence identified (anger, depression, anxiety, talking, listening, friendship, medication and eating).  Patients were to then come up with 3 effective coping techniques to address each identified area in the remaining boxes stemming from a particular incidents.  Patients shared their responses and LRT wrote them on the board.  Patients were also given a sheet with 100 extra coping skills at the end of group.    Affect/Mood: Appropriate   Participation Level: Engaged   Participation Quality: Independent   Behavior: Appropriate   Speech/Thought Process: Focused   Insight: Good   Judgement: Good   Modes of Intervention: Worksheet   Patient Response to Interventions:  Engaged   Education Outcome:  Acknowledges education   Clinical Observations/Individualized Feedback: Pt was appropriate and engaged during group session.  Pt was also attentive to the responses of his peers.  Pt came up with talk to a therapist for depression; stay busy for anxiety; don't yell at people with talking; and don't OD for meditation.     Plan: Continue to engage patient in RT group sessions 2-3x/week.   Omar Owen, LRT,CTRS  06/09/2023 1:14 PM

## 2023-06-10 LAB — LIPID PANEL
Cholesterol: 132 mg/dL (ref 0–200)
HDL: 53 mg/dL (ref >40)
LDL Cholesterol: 69 mg/dL (ref 0–99)
Total CHOL/HDL Ratio: 2.5 RATIO
Triglycerides: 50 mg/dL (ref <150)
VLDL: 10 mg/dL (ref 0–40)

## 2023-06-10 LAB — HEMOGLOBIN A1C
Hgb A1c MFr Bld: 4.9 % (ref 4.8–5.6)
Mean Plasma Glucose: 93.93 mg/dL

## 2023-06-10 MED ORDER — TRAZODONE HCL 100 MG PO TABS
100.0000 mg | ORAL_TABLET | Freq: Every evening | ORAL | Status: DC | PRN
  Filled 2023-06-10 (×2): qty 1
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
## 2023-06-10 NOTE — Progress Notes (Signed)
Adult Psychoeducational Group Note  Date:  06/10/2023 Time:  11:00 PM  Group Topic/Focus:  Wrap-Up Group:   The focus of this group is to help patients review their daily goal of treatment and discuss progress on daily workbooks.  Participation Level:  Active  Participation Quality:  Appropriate  Affect:  Appropriate  Cognitive:  Appropriate  Insight: Appropriate  Engagement in Group:  Engaged  Modes of Intervention:  Discussion and Support  Additional Comments:  Pt states goal today, was to wake up with a positive attitude. Pt states something positive that happening was leaving [address] and going to [address]. Pt states talking to dad. Tomorrow, pt wants to work on getting discharged.  Omar Owen Katrinka Blazing 06/10/2023, 11:00 PM

## 2023-06-10 NOTE — Progress Notes (Signed)
   06/10/23 0532  15 Minute Checks  Location Bedroom  Visual Appearance Calm  Behavior Sleeping  Sleep (Behavioral Health Patients Only)  Calculate sleep? (Click Yes once per 24 hr at 0600 safety check) Yes  Documented sleep last 24 hours 7.5

## 2023-06-10 NOTE — Progress Notes (Signed)
Adult Psychoeducational Group Note  Date:  06/10/2023 Time:  10:38 AM  Group Topic/Focus:  Goals Group:   The focus of this group is to help patients establish daily goals to achieve during treatment and discuss how the patient can incorporate goal setting into their daily lives to aide in recovery.  Participation Level:  Active  Participation Quality:  Appropriate  Affect:  Appropriate  Cognitive:  Alert  Insight: Good  Engagement in Group:  Engaged  Modes of Intervention:  Discussion  Additional Comments:  Pt actively participated and was engaged in group   [employer] 06/10/2023, 10:38 AM

## 2023-06-10 NOTE — Group Note (Unsigned)
Date:  06/12/2023 Time:  10:20 AM  Group Topic/Focus:  Goals Group:   The focus of this group is to help patients establish daily goals to achieve during treatment and discuss how the patient can incorporate goal setting into their daily lives to aide in recovery. Orientation:   The focus of this group is to educate the patient on the purpose and policies of crisis stabilization and provide a format to answer questions about their admission.  The group details unit policies and expectations of patients while admitted.    Participation Level:  Active  Participation Quality:  Appropriate and Attentive  Affect:  Appropriate  Cognitive:  Appropriate  Insight: Appropriate  Engagement in Group:  Engaged  Modes of Intervention:  Discussion  Additional Comments:  Patient attended goal group and was attentive the duration of the group. Patient's goal was to be more active during the day.   Skyler Dusing T Stefan Markarian 06/12/2023, 10:20 AM  

## 2023-06-10 NOTE — [facility] Suicide Risk Assessment (Signed)
[facility] INPATIENT:  Family/Significant Other Suicide Prevention Education  Suicide Prevention Education:  Education Completed; Martavis Gurney ,  [phone] (name of family member/significant other) has been identified by the patient as the family member/significant other with whom the patient will be residing, and identified as the person(s) who will aid the patient in the event of a mental health crisis (suicidal ideations/suicide attempt).  With written consent from the patient, the family member/significant other has been provided the following suicide prevention education, prior to the and/or following the discharge of the patient.   CSW spoke with patient mother who reports that patient has never acted like this before.  She reports that he has had increased alcohol intake and is aware of marijuana use.  She reports that she visited yesterday and he seems to be doing better.  She confirmed that guns/weapons removed from house and access to knives will be taken away and locked up.  No additional safety concerns.  Mother discussed getting connected to family therapy for ongoing treatment and is aware of appointments set up for patient.   The suicide prevention education provided includes the following: Suicide risk factors Suicide prevention and interventions National Suicide Hotline telephone number [facility] assessment telephone number [facility] Emergency Assistance 911 [facility] and/or Residential Mobile Crisis Unit telephone number  Request made of family/significant other to: Remove weapons (e.g., guns, rifles, knives), all items previously/currently identified as safety concern.   Remove drugs/medications (over-the-counter, prescriptions, illicit drugs), all items previously/currently identified as a safety concern.  The family member/significant other verbalizes understanding of the suicide prevention education information provided.  The family member/significant  other agrees to remove the items of safety concern listed above.  Dillan Candela E Kazaria Gaertner 06/10/2023, 1:53 PM

## 2023-06-10 NOTE — Progress Notes (Signed)
Patient appears animated and socializing appropriately. Patient states he experienced some "jerking" 2 nights ago and restlessness that disturbed his sleep. Patient reports adequate appetite, normal energy level, denies SI,HI, and A/V/H with no plan or intent. Patient's goal for today is to "stay calm and focused." Patient has been attending groups and remains cooperative on unit.

## 2023-06-10 NOTE — Group Note (Signed)
Recreation Therapy Group Note   Group Topic:Problem Solving  Group Date: 06/10/2023 Start Time: 1038 End Time: 1115 Facilitators: Caryle Helgeson-McCall, LRT,CTRS Location: [address] Dayroom   Goal Area(s) Addresses:  Patient will effectively work with peer towards shared goal.  Patient will identify skills used to make activity successful.  Patient will share challenges and verbalize solution-driven approaches used. Patient will identify how skills used during activity can be used to reach post d/c goals.   Group Description:  [employer]. Patients were provided the following materials: 2 drinking straws, 5 rubber bands, 5 paper clips, 2 index cards and 2 drinking cups. Using the provided materials patients were asked to build a launching mechanism to launch a ping pong ball across the room, approximately 10 feet. Patients were divided into teams of 3-5. Instructions required all materials be incorporated into the device, functionality of items left to the peer group's discretion.   Affect/Mood: Appropriate   Participation Level: Engaged   Participation Quality: Independent   Behavior: Appropriate   Speech/Thought Process: Focused   Insight: Good   Judgement: Good   Modes of Intervention: STEM Activity   Patient Response to Interventions:  Engaged   Education Outcome:  Acknowledges education   Clinical Observations/Individualized Feedback: Pt was bright and intrigued during group.  Pt worked hard with peer to come up with a concept to complete activity.  Pt was determined to be successful with the activity.  Pt and peer equally shared ideas to complete task.  Pt expressed the hardest part was getting the paperclips how they needed them.     Plan: Continue to engage patient in RT group sessions 2-3x/week.   Rania Prothero-McCall, LRT,CTRS 06/10/2023 11:49 AM

## 2023-06-10 NOTE — Progress Notes (Signed)
   06/10/23 2300  Psych Admission Type (Psych Patients Only)  Admission Status Involuntary  Psychosocial Assessment  Patient Complaints None  Eye Contact Fair  Facial Expression Animated  Affect Appropriate to circumstance  Speech Logical/coherent  Interaction Assertive  Motor Activity Slow  Appearance/Hygiene Unremarkable  Behavior Characteristics Cooperative;Appropriate to situation  Mood Pleasant  Thought Process  Coherency WDL  Content WDL  Delusions None reported or observed  Perception WDL  Hallucination None reported or observed  Judgment Poor  Confusion None  Danger to Self  Current suicidal ideation? Denies  Danger to Others  Danger to Others None reported or observed  Danger to Others Abnormal  Harmful Behavior to others No threats or harm toward other people  Destructive Behavior No threats or harm toward property

## 2023-06-10 NOTE — Plan of Care (Signed)
  Problem: Education: Goal: Mental status will improve Outcome: Progressing   Problem: Activity: Goal: Interest or engagement in activities will improve Outcome: Progressing   Problem: Health Behavior/Discharge Planning: Goal: Compliance with treatment plan for underlying cause of condition will improve Outcome: Progressing   

## 2023-06-10 NOTE — Progress Notes (Signed)
  Administered PRN Trazodone per MAR per patient request. 

## 2023-06-11 NOTE — Progress Notes (Signed)
D: Patient is alert, oriented, pleasant, and cooperative. Denies SI, HI, AVH, and verbally contracts for safety. Patient reports he slept good last night. Patient reports his appetite as good, energy level as high, and concentration as good. Patient rates his depression 0/10, hopelessness 0/10, and anxiety 0/10. Patient denies physical symptoms/pain.    A: Scheduled medications administered per MD order. PRN nicotine gum administered. Support provided. Patient educated on safety on the unit and medications. Routine safety checks every 15 minutes. Patient stated understanding to tell nurse about any new physical symptoms. Patient understands to tell staff of any needs.     R: No adverse drug reactions noted. Patient verbally contracts for safety. Patient remains safe at this time and will continue to monitor.    06/11/23 1100  Psych Admission Type (Psych Patients Only)  Admission Status Involuntary  Psychosocial Assessment  Patient Complaints None  Eye Contact Fair  Facial Expression Animated  Affect Appropriate to circumstance  Speech Logical/coherent  Interaction Assertive  Motor Activity Other (Comment) (wnl)  Appearance/Hygiene Unremarkable  Behavior Characteristics Cooperative;Appropriate to situation  Mood Pleasant  Thought Process  Coherency WDL  Content WDL  Delusions None reported or observed  Perception WDL  Hallucination None reported or observed  Judgment Limited  Confusion None  Danger to Self  Current suicidal ideation? Denies  Danger to Others  Danger to Others None reported or observed  Danger to Others Abnormal  Harmful Behavior to others No threats or harm toward other people  Destructive Behavior No threats or harm toward property

## 2023-06-11 NOTE — Group Note (Signed)
Date:  06/12/2023 Time:  10:37 AM  Group Topic/Focus:  Goals Group:   The focus of this group is to help patients establish daily goals to achieve during treatment and discuss how the patient can incorporate goal setting into their daily lives to aide in recovery.    Participation Level:  Active  Participation Quality:  Attentive  Affect:  Appropriate  Cognitive:  Appropriate  Insight: Appropriate  Engagement in Group:  Engaged  Modes of Intervention:  Discussion  Additional Comments:  Patient attended group and was attentive the duration of it. Patient's goal was to come up with a discharge plan.   Ranvir Renovato T Lorraine Lax 06/12/2023, 10:37 AM

## 2023-06-11 NOTE — Group Note (Signed)
Recreation Therapy Group Note   Group Topic:Leisure Education  Group Date: 06/11/2023 Start Time: 0930 End Time: 1000 Facilitators: Kassia Demarinis-McCall, LRT,CTRS Location: [address] Dayroom   Goal Area(s) Addresses:  Patient will successfully identify positive leisure and recreation activities.  Patient will acknowledge benefits of participation in healthy leisure activities post discharge.  Patient will actively work with peers toward a shared goal.    Group Description: Pictionary. In groups of 5-7, patients took turns trying to guess the picture being drawn on the board by their teammate.  If the team guessed the correct answer, they won a point.  If the team guessed wrong, the other team got a chance to steal the point. After several rounds of game play, the team with the most points were declared winners. Post-activity discussion reviewed benefits of positive recreation outlets: reducing stress, improving coping mechanisms, increasing self-esteem, and building larger support systems.    Clinical Observations/Individualized Feedback: Group unable to be facilitated due orientation group running into recreation therapy group time.   Plan: Continue to engage patient in RT group sessions 2-3x/week.   Omar Owen, LRT,CTRS 06/11/2023 2:14 PM
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
## 2023-06-11 NOTE — Group Note (Signed)
Date:  06/11/2023 Time:  10:11 PM  Group Topic/Focus:  NA/WRAP Up and Wind Down Group    Participation Level:  Active  Participation Quality:  Attentive  Affect:  Appropriate  Cognitive:  Alert  Insight: Appropriate  Engagement in Group:  Engaged  Modes of Intervention:  Education and Support  Additional Comments:   Pt attended the NA/Wrap Up and Wind Down group.  Edmund Hilda Lindzy Rupert 06/11/2023, 10:11 PM

## 2023-06-11 NOTE — BHH Group Notes (Signed)
Adult Psychoeducational Group Note  Date:  06/11/2023 Time:  4:53 PM  Group Topic/Focus:  Emotional Education:   The focus of this group is to discuss what feelings/emotions are, and how they are experienced.  Participation Level:  Active  Participation Quality:  Appropriate  Affect:  Appropriate  Cognitive:  Appropriate  Insight: Appropriate  Engagement in Group:  Engaged  Modes of Intervention:  Activity  Additional Comments:  Pt was able to listen to instructions on how to journal. Pt journaled for 5-10 minutes and participated in discussion with peers.   Baldwin Jamaica 06/11/2023, 4:53 PM

## 2023-06-12 DIAGNOSIS — F19959 Other psychoactive substance use, unspecified with psychoactive substance-induced psychotic disorder, unspecified: Principal | ICD-10-CM

## 2023-06-12 MED ORDER — SERTRALINE HCL 50 MG PO TABS: 50.0000 mg | ORAL_TABLET | Freq: Every day | ORAL | 0 refills | Status: DC

## 2023-06-12 MED ORDER — OLANZAPINE 5 MG PO TBDP: 5.0000 mg | ORAL_TABLET | Freq: Every day | ORAL | 0 refills | Status: DC

## 2023-06-12 MED ORDER — NICOTINE POLACRILEX 2 MG MT GUM: 2.0000 mg | CHEWING_GUM | OROMUCOSAL | 0 refills | Status: DC | PRN
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
## 2023-06-12 NOTE — Progress Notes (Signed)
  [facility] Case Management Discharge Plan :  Will you be returning to the same living situation after discharge:  Yes,  home  At discharge, do you have transportation home?: Yes,  grandmother will be picking patient up Do you have the ability to pay for your medications: Yes,  insurance   Release of information consent forms completed and in the chart;  Patient's signature needed at discharge.  Patient to Follow up at:  Follow-up Information     [facility]. Go on 06/16/2023.   Why: You have a hospital follow up appointment to obtain therapy and medication management services on 06/16/23 at 10:00 am.  This appointment will be held in person. Contact information: [address] [place] [address] [phone]                 Next level of care provider has access to [facility] Link:no  Safety Planning and Suicide Prevention discussed: Yes,  Mother,      Has patient been referred to the Quitline?: Patient refused referral for treatment  Patient has been referred for addiction treatment: No known substance use disorder.  Omar Owen Omar Bostyn Bogie, LCSW 06/12/2023, 9:18 AM

## 2023-06-12 NOTE — Progress Notes (Signed)
   06/11/23 2130  Psych Admission Type (Psych Patients Only)  Admission Status Involuntary  Psychosocial Assessment  Patient Complaints None  Eye Contact Fair  Facial Expression Animated  Affect Appropriate to circumstance  Speech Logical/coherent  Interaction Assertive  Motor Activity Other (Comment) (WNL)  Appearance/Hygiene Unremarkable  Behavior Characteristics Cooperative;Calm  Mood Pleasant  Thought Process  Coherency WDL  Content WDL  Delusions None reported or observed  Perception WDL  Hallucination None reported or observed  Judgment Impaired  Confusion None  Danger to Self  Current suicidal ideation? Denies  Danger to Others  Danger to Others None reported or observed  Danger to Others Abnormal  Harmful Behavior to others No threats or harm toward other people  Destructive Behavior No threats or harm toward property   Pt reports 0/10 anxiety and depression. Pt is pleasant and cooperative. Pt was offered support and encouragement. Pt was given scheduled medications. Q 15 minute checks were done for safety. Pt attended group and interacts well with peers and staff.  Pt has no complaints.Pt receptive to treatment and safety maintained on unit.

## 2023-06-12 NOTE — Progress Notes (Signed)
Discharge Note:  Patient discharged home. Patient denied SI and HI. Denied A/V hallucinations. Suicide prevention information given and discussed with patient who stated they understood and had no questions. Patient stated they received all their belongings, clothing, toiletries, misc items, etc. Patient stated they appreciated all assistance received from [facility] staff. All required discharge information given to patient. 

## 2023-09-15 ENCOUNTER — Emergency Department (HOSPITAL_COMMUNITY)
Admission: EM | Admit: 2023-09-15 | Discharge: 2023-09-17 | Disposition: A | Discharge status: Other Acute Inpatient Hospital | Payer: Self-pay | Attending: Student | Admitting: Student

## 2023-09-15 ENCOUNTER — Other Ambulatory Visit: Payer: Self-pay

## 2023-09-15 DIAGNOSIS — F19151 Other psychoactive substance abuse with psychoactive substance-induced psychotic disorder with hallucinations: Secondary | ICD-10-CM | POA: Insufficient documentation

## 2023-09-15 DIAGNOSIS — F419 Anxiety disorder, unspecified: Secondary | ICD-10-CM | POA: Insufficient documentation

## 2023-09-15 DIAGNOSIS — F29 Unspecified psychosis not due to a substance or known physiological condition: Secondary | ICD-10-CM

## 2023-09-15 LAB — RAPID URINE DRUG SCREEN, HOSP PERFORMED
Amphetamines: NOT DETECTED
Barbiturates: NOT DETECTED
Benzodiazepines: NOT DETECTED
Cocaine: NOT DETECTED
Opiates: NOT DETECTED
Tetrahydrocannabinol: POSITIVE — AB

## 2023-09-15 LAB — CBC
HCT: 44.6 % (ref 39.0–52.0)
Hemoglobin: 14.7 g/dL (ref 13.0–17.0)
MCH: 30.2 pg (ref 26.0–34.0)
MCHC: 33 g/dL (ref 30.0–36.0)
MCV: 91.8 fL (ref 80.0–100.0)
Platelets: 252 10*3/uL (ref 150–400)
RBC: 4.86 MIL/uL (ref 4.22–5.81)
RDW: 12.4 % (ref 11.5–15.5)
WBC: 8.5 10*3/uL (ref 4.0–10.5)
nRBC: 0 % (ref 0.0–0.2)

## 2023-09-15 LAB — ACETAMINOPHEN LEVEL: Acetaminophen (Tylenol), Serum: <10 ug/mL — ABNORMAL LOW (ref 10–30)

## 2023-09-15 LAB — COMPREHENSIVE METABOLIC PANEL
ALT: 13 U/L (ref 0–44)
AST: 18 U/L (ref 15–41)
Albumin: 4.3 g/dL (ref 3.5–5.0)
Alkaline Phosphatase: 54 U/L (ref 38–126)
Anion gap: 10 (ref 5–15)
BUN: 11 mg/dL (ref 6–20)
CO2: 27 mmol/L (ref 22–32)
Calcium: 9.1 mg/dL (ref 8.9–10.3)
Chloride: 99 mmol/L (ref 98–111)
Creatinine, Ser: 1.02 mg/dL (ref 0.61–1.24)
GFR, Estimated: >60 mL/min (ref >60)
Glucose, Bld: 86 mg/dL (ref 70–99)
Potassium: 3.5 mmol/L (ref 3.5–5.1)
Sodium: 136 mmol/L (ref 135–145)
Total Bilirubin: 1.2 mg/dL (ref 0.3–1.2)
Total Protein: 7.2 g/dL (ref 6.5–8.1)

## 2023-09-15 LAB — ETHANOL: Alcohol, Ethyl (B): <10 mg/dL (ref <10)

## 2023-09-15 LAB — SALICYLATE LEVEL: Salicylate Lvl: <7 mg/dL — ABNORMAL LOW (ref 7.0–30.0)

## 2023-09-15 MED ORDER — ACETAMINOPHEN 325 MG PO TABS: 650.0000 mg | ORAL_TABLET | ORAL | Status: DC | PRN

## 2023-09-15 NOTE — ED Triage Notes (Signed)
Pt BIB IVC [facility] for being Schizophrenic and not taken medication, manic last couple days, threatening family that he lives with.

## 2023-09-15 NOTE — ED Notes (Signed)
When pt returned from bathroom sitter visualized pt drop his vape on the floor. Per the sitter, the pt picked the vape off the floor and stated to the sitter "you didn't see that" and pt put vape into his pocket. Security was called and pt was wanded.Pt returned to stretcher at this time. Pts vape was placed with his belongings.

## 2023-09-15 NOTE — ED Notes (Signed)
Sitter noticed pt had a vape on his person. This RN called security to have pt wanded and vape confiscated. Pt's Vape is with security

## 2023-09-15 NOTE — ED Provider Notes (Signed)
[place] EMERGENCY DEPARTMENT AT [facility] Provider Note   CSN: [id number] Arrival date & time: 09/15/23  1624     History  Chief Complaint  Patient presents with   V70.1    (IVC)    Omar Owen is a 22 y.o. male with a history including ADHD and schizophrenia who has been noncompliant with his medications the past several days, presenting with IVC papers completed by his mother with whom he lives.  Per mother's report he has been having increased problems with manic behavior and has become threatening to family members.  Patient denies threatening behavior, simply states he is having "home issues", stating he needs to be on his own rather than living in his parents home.  He has no physical complaints,  The history is provided by the patient.       Home Medications Prior to Admission medications   Not on File      Allergies    Patient has no known allergies.    Review of Systems   Review of Systems  Constitutional:  Negative for fever.  HENT:  Negative for congestion and sore throat.   Eyes: Negative.   Respiratory:  Negative for chest tightness and shortness of breath.   Cardiovascular:  Negative for chest pain.  Gastrointestinal:  Negative for abdominal pain and nausea.  Genitourinary: Negative.   Musculoskeletal:  Negative for arthralgias, joint swelling and neck pain.  Skin: Negative.  Negative for rash and wound.  Neurological:  Negative for dizziness, weakness, light-headedness, numbness and headaches.  Psychiatric/Behavioral:  Positive for behavioral problems. Negative for self-injury and suicidal ideas. The patient is hyperactive.     Physical Exam Updated Vital Signs BP 120/80 (BP Location: Right Arm)   Pulse 64   Temp 98.4 F (36.9 C)   Resp 18   Ht 6\' 1"  (1.854 m)   Wt 68 kg   SpO2 99%   BMI 19.79 kg/m  Physical Exam Vitals and nursing note reviewed.  Constitutional:      Appearance: He is well-developed.  HENT:     Head:  Normocephalic and atraumatic.  Eyes:     Conjunctiva/sclera: Conjunctivae normal.  Cardiovascular:     Rate and Rhythm: Normal rate and regular rhythm.     Heart sounds: Normal heart sounds.  Pulmonary:     Effort: Pulmonary effort is normal.     Breath sounds: Normal breath sounds. No wheezing.  Abdominal:     General: Bowel sounds are normal.     Palpations: Abdomen is soft.     Tenderness: There is no abdominal tenderness.  Musculoskeletal:        General: Normal range of motion.     Cervical back: Normal range of motion.  Skin:    General: Skin is warm and dry.  Neurological:     General: No focal deficit present.     Mental Status: He is alert and oriented to person, place, and time.  Psychiatric:        Attention and Perception: He does not perceive auditory or visual hallucinations.        Mood and Affect: Mood is anxious.        Speech: Speech normal.        Behavior: Behavior is cooperative.        Thought Content: Thought content does not include homicidal or suicidal plan.     ED Results / Procedures / Treatments   Labs (all labs ordered are listed, but  only abnormal results are displayed) Labs Reviewed  SALICYLATE LEVEL - Abnormal; Notable for the following components:      Result Value   Salicylate Lvl <7.0 (*)    All other components within normal limits  ACETAMINOPHEN LEVEL - Abnormal; Notable for the following components:   Acetaminophen (Tylenol), Serum <10 (*)    All other components within normal limits  RAPID URINE DRUG SCREEN, HOSP PERFORMED - Abnormal; Notable for the following components:   Tetrahydrocannabinol POSITIVE (*)    All other components within normal limits  COMPREHENSIVE METABOLIC PANEL  ETHANOL  CBC    EKG None  Radiology No results found.  Procedures Procedures    Medications Ordered in ED Medications - No data to display  ED Course/ Medical Decision Making/ A&P                                 Medical Decision  Making Patient presenting with noncompliance of his psychiatric medications, history of schizophrenia, per mother he has had escalating mania and threatening family members.  On my initial exam he is awake, cooperative, appears relatively calm.  However given his history and mother's statement on IVC papers will plan TTS consult once he is medically cleared.  Labs have been ordered.  Call placed to mother for additional clarification, phone # provided not in service, fathers phone - restricted, unable to leave message.   8:26 PM is medically cleared for TTS consult.  Amount and/or Complexity of Data Reviewed Labs: ordered.           Final Clinical Impression(s) / ED Diagnoses Final diagnoses:  None    Rx / DC Orders ED Discharge Orders     None         Victoriano Lain 09/15/23 2026    Glendora Score, MD 09/16/23 [phone]

## 2023-09-15 NOTE — ED Notes (Signed)
Mother Crystal new phone number [phone]

## 2023-09-16 DIAGNOSIS — F29 Unspecified psychosis not due to a substance or known physiological condition: Secondary | ICD-10-CM

## 2023-09-16 MED ORDER — NICOTINE 21 MG/24HR TD PT24
21.0000 mg | MEDICATED_PATCH | Freq: Once | TRANSDERMAL | Status: DC
  Administered 2023-09-16: 21 mg via TRANSDERMAL
  Filled 2023-09-16: qty 1

## 2023-09-16 MED ORDER — OLANZAPINE 10 MG IM SOLR: 5.0000 mg | Freq: Three times a day (TID) | INTRAMUSCULAR | Status: DC | PRN

## 2023-09-16 MED ORDER — LORAZEPAM 1 MG PO TABS: 1.0000 mg | ORAL_TABLET | Freq: Three times a day (TID) | ORAL | Status: DC | PRN

## 2023-09-16 MED ORDER — OLANZAPINE 5 MG PO TBDP
5.0000 mg | ORAL_TABLET | Freq: Every day | ORAL | Status: DC
  Administered 2023-09-16: 5 mg via ORAL
  Filled 2023-09-16: qty 1

## 2023-09-16 NOTE — Progress Notes (Signed)
LCSW Progress Note  [id number]   Omar Owen  09/16/2023  2:55 PM  Description:   Inpatient Psychiatric Referral  [name], NP. There are no [facility] beds at [facility], per [facility] Rona Ravens, RN. Patient was referred to the following out of network facilities:   Destination  Service Provider Address Phone Fax  [facility]  [address], [place] [address] [phone]  [facility] [address]  [place] [address] [phone]  [facility]  [address] [place] [address] [phone]  [facility]  [address] [place] [address] [phone]  [facility] [address] [place]  [address] [place], [place] [address] [phone]  [facility]  [address], [place] [address] [phone]  [facility]  [address] [place], [place] [address] [phone]  [facility]  [address] [place] [address] [phone]  [facility]  [address] [place] [address] [phone]  [facility]  [address], [place] [address] [phone]  [facility]  [address], [place] [address] [phone]  [facility]  [address] [place] [address] [phone]  [facility]  [place] [phone]  [facility]  [address] [place], [place] [phone]  [facility]  [address] [place] [address] [phone]  [facility]  [address], [place] [address] [phone]  [facility]  [address]  [place], [place] [address] [phone]  [facility]  [address] [place] [address] [phone]  [facility] EFAX  [place] [phone]    Situation ongoing, CSW to continue following and update chart as more information becomes available.      Cathie Beams, [place]  09/16/2023 2:55 PM
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
## 2023-09-17 ENCOUNTER — Encounter (HOSPITAL_COMMUNITY): Payer: Self-pay

## 2023-09-17 NOTE — Discharge Instructions (Addendum)
Transfer to [facility]om[facility] Longs accepting

## 2023-09-17 NOTE — ED Notes (Addendum)
Called [facility] to give report on incoming pt.

## 2023-09-17 NOTE — Progress Notes (Signed)
LCSW Progress Note  [id number]   Omar Owen  09/17/2023  12:47 AM    Inpatient Behavioral Health Placement  Pt meets inpatient criteria per Norval Morton, NP Telepsychiatry Consult Services. There are no available beds within [facility] system per [facility] Danika Riley,RN. Referral was sent to the following facilities;   Destination  Service Provider Address Phone Fax  [facility]  [address], [place] [address] [phone]  [facility] [address]  [place] [address] [phone]  [facility]  [address] [place] [address] [phone]  [facility] [address]  [address] [place] [address] [phone]  [facility] [address] [place]  [address] [place], [place] [address] [phone]  [facility]  [address], [place] [address] [phone]  [facility]  [address] [place], [place] [address] [phone]  [facility]  [address] [place] [address] [phone]  [facility]  [address] [place] [address] [phone]  [facility]  [address], [place] [address] [phone]  [facility]  [address] [place] [address] [phone]  [facility]  [address] [place] [address] [phone]  [facility]  [place] [phone]  [facility] EFA[facility]  [address] [place], [place] [phone]  [facility]  [address] [place] [address] [phone]  [facility]  [address], [place] [address] [phone]  [facility]  [address] [place], [place] [address] [phone]  [facility]  [address] [place] [address] [phone]  [facility] EFAX  [place] [phone]    Situation ongoing,  CSW will follow up.    Maryjean Ka, MSW, [facility] 09/17/2023 12:47 AM

## 2023-09-17 NOTE — ED Notes (Signed)
Attempted to call [facility] to confirm IVC paperwork has been received no answer at this time.

## 2023-09-17 NOTE — ED Notes (Signed)
Pt attempted at this time to leave the ED. Pt was escorted back to room by security. This RN asked pt if he wanted medication to help relax him. Pt states "I don't want shit."

## 2023-09-17 NOTE — ED Notes (Signed)
IVC paperwork faxed to [facility]. Pending review of paperwork patient accepted by Dr. Jomarie Longs and may go at anytime. EDP notified.

## 2023-10-26 ENCOUNTER — Other Ambulatory Visit: Payer: Self-pay

## 2023-10-26 ENCOUNTER — Encounter (HOSPITAL_COMMUNITY): Payer: Self-pay | Admitting: Radiology

## 2023-10-26 ENCOUNTER — Emergency Department (HOSPITAL_COMMUNITY)
Admission: EM | Admit: 2023-10-26 | Discharge: 2023-10-27 | Disposition: A | Discharge status: Other Acute Inpatient Hospital | Payer: Self-pay | Attending: Emergency Medicine | Admitting: Emergency Medicine

## 2023-10-26 DIAGNOSIS — F333 Major depressive disorder, recurrent, severe with psychotic symptoms: Secondary | ICD-10-CM | POA: Insufficient documentation

## 2023-10-26 DIAGNOSIS — F29 Unspecified psychosis not due to a substance or known physiological condition: Secondary | ICD-10-CM

## 2023-10-26 HISTORY — DX: Schizophrenia, unspecified: F20.9

## 2023-10-26 LAB — CBC
HCT: 42.8 % (ref 39.0–52.0)
Hemoglobin: 14.7 g/dL (ref 13.0–17.0)
MCH: 30.8 pg (ref 26.0–34.0)
MCHC: 34.3 g/dL (ref 30.0–36.0)
MCV: 89.5 fL (ref 80.0–100.0)
Platelets: 259 10*3/uL (ref 150–400)
RBC: 4.78 MIL/uL (ref 4.22–5.81)
RDW: 12.2 % (ref 11.5–15.5)
WBC: 8.3 10*3/uL (ref 4.0–10.5)
nRBC: 0 % (ref 0.0–0.2)

## 2023-10-26 LAB — COMPREHENSIVE METABOLIC PANEL
ALT: 14 U/L (ref 0–44)
AST: 20 U/L (ref 15–41)
Albumin: 4.6 g/dL (ref 3.5–5.0)
Alkaline Phosphatase: 60 U/L (ref 38–126)
Anion gap: 10 (ref 5–15)
BUN: 12 mg/dL (ref 6–20)
CO2: 25 mmol/L (ref 22–32)
Calcium: 9.1 mg/dL (ref 8.9–10.3)
Chloride: 103 mmol/L (ref 98–111)
Creatinine, Ser: 0.86 mg/dL (ref 0.61–1.24)
GFR, Estimated: >60 mL/min (ref >60)
Glucose, Bld: 110 mg/dL — ABNORMAL HIGH (ref 70–99)
Potassium: 3.4 mmol/L — ABNORMAL LOW (ref 3.5–5.1)
Sodium: 138 mmol/L (ref 135–145)
Total Bilirubin: 0.8 mg/dL (ref 0.3–1.2)
Total Protein: 7.4 g/dL (ref 6.5–8.1)

## 2023-10-26 LAB — ETHANOL: Alcohol, Ethyl (B): <10 mg/dL (ref <10)

## 2023-10-26 LAB — SALICYLATE LEVEL: Salicylate Lvl: <7 mg/dL — ABNORMAL LOW (ref 7.0–30.0)

## 2023-10-26 LAB — ACETAMINOPHEN LEVEL: Acetaminophen (Tylenol), Serum: <10 ug/mL — ABNORMAL LOW (ref 10–30)
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
## 2023-10-26 NOTE — ED Provider Notes (Signed)
[place] EMERGENCY DEPARTMENT AT [facility] Provider Note   CSN: [id number] Arrival date & time: 10/26/23  2023     History  Chief Complaint  Patient presents with   Mental Health Problem    Omar Owen is a 22 y.o. male history of ADHD, schizophrenia presents today for evaluation of paranoia.  Patient was reported to be paranoid at home.  Patient first said he had been taking all his medications then he said he did not know what happened to them.  He has broken televisions in the home due to feeling like someone was watching him.  He states that he had an argument with his mom because he was not happy about things were moved around the house after he got home.  He denies any visual or auditory hallucinations.  He denies SI/HI.  Denies any chest pain, shortness of breath, nausea, vomiting, bowel change, urinary symptoms.   Mental Health Problem   Past Medical History:  Diagnosis Date   ADHD    Schizophrenia (HCC)    History reviewed. No pertinent surgical history.   Home Medications Prior to Admission medications   Not on File      Allergies    Patient has no known allergies.    Review of Systems   Review of Systems Negative except as per HPI.  Physical Exam Updated Vital Signs BP 138/88   Pulse 88   Temp 98.3 F (36.8 C) (Oral)   Resp 17   Ht 6' (1.829 m)   Wt 59 kg   SpO2 99%   BMI 17.63 kg/m  Physical Exam Vitals and nursing note reviewed.  Constitutional:      Appearance: Normal appearance.  HENT:     Head: Normocephalic and atraumatic.     Mouth/Throat:     Mouth: Mucous membranes are moist.  Eyes:     General: No scleral icterus. Cardiovascular:     Rate and Rhythm: Normal rate and regular rhythm.     Pulses: Normal pulses.     Heart sounds: Normal heart sounds.  Pulmonary:     Effort: Pulmonary effort is normal.     Breath sounds: Normal breath sounds.  Abdominal:     General: Abdomen is flat.     Palpations: Abdomen is  soft.     Tenderness: There is no abdominal tenderness.  Musculoskeletal:        General: No deformity.  Skin:    General: Skin is warm.     Findings: No rash.  Neurological:     General: No focal deficit present.     Mental Status: He is alert.  Psychiatric:        Mood and Affect: Mood normal.     Comments: Patient is calm and cooperative.     ED Results / Procedures / Treatments   Labs (all labs ordered are listed, but only abnormal results are displayed) Labs Reviewed  COMPREHENSIVE METABOLIC PANEL  ETHANOL  SALICYLATE LEVEL  ACETAMINOPHEN LEVEL  CBC  RAPID URINE DRUG SCREEN, HOSP PERFORMED    EKG None  Radiology No results found.  Procedures Procedures    Medications Ordered in ED Medications - No data to display  ED Course/ Medical Decision Making/ A&P                                 Medical Decision Making Amount and/or Complexity of Data Reviewed Labs: ordered.  22 year old male presents for evaluation of paranoia.  He has a history of medication noncompliance, schizophrenia.  Per report from IVC paperwork patient has escalating mania, breaking his televisions in the home.  On my examination patient is awake, cooperative and calm.  Given his history and mother's statement on IVC papers we will plan TTS consult once he is medically cleared.  10:31 PM  Patient is medically for TTS consult.  Disposition depends on TTS consult.        Final Clinical Impression(s) / ED Diagnoses Final diagnoses:  Psychosis, unspecified psychosis type [facility])    Rx / DC Orders ED Discharge Orders     None         Jeanelle Malling, [place] 10/26/23 2243    Anders Simmonds T, DO 10/28/23 2349

## 2023-10-26 NOTE — ED Notes (Signed)
Pt speaking with father on the phone.

## 2023-10-26 NOTE — ED Notes (Signed)
Black slippers Shirt Pants Socks Vape Hoodie Coat 45.65

## 2023-10-26 NOTE — ED Notes (Signed)
Pt has been asked for urine sample 3 times and states that "he can't pee".

## 2023-10-26 NOTE — ED Notes (Signed)
Pt attempted to give a urine specimen up full of water to this nurse.

## 2023-10-26 NOTE — ED Notes (Signed)
Pt mother called for updates on patient. Pt okay 'd this nurse to speak with his mother.

## 2023-10-26 NOTE — ED Notes (Signed)
IVC PAPERWORK FAXED TO [facility] @ [phone] & [phone]

## 2023-10-26 NOTE — ED Notes (Signed)
Pt in for TTS at this time

## 2023-10-27 ENCOUNTER — Inpatient Hospital Stay (HOSPITAL_COMMUNITY)
Admission: AD | Admit: 2023-10-27 | Discharge: 2023-11-06 | DRG: 885 | Disposition: A | Discharge status: Home / Self Care | Payer: Commercial Managed Care - PPO | Source: Intra-hospital | Attending: Psychiatry | Admitting: Psychiatry

## 2023-10-27 ENCOUNTER — Encounter (HOSPITAL_COMMUNITY): Payer: Self-pay | Admitting: Psychiatric/Mental Health

## 2023-10-27 DIAGNOSIS — F209 Schizophrenia, unspecified: Secondary | ICD-10-CM | POA: Diagnosis present

## 2023-10-27 DIAGNOSIS — Z560 Unemployment, unspecified: Secondary | ICD-10-CM | POA: Diagnosis not present

## 2023-10-27 DIAGNOSIS — F1290 Cannabis use, unspecified, uncomplicated: Secondary | ICD-10-CM | POA: Diagnosis present

## 2023-10-27 DIAGNOSIS — F909 Attention-deficit hyperactivity disorder, unspecified type: Secondary | ICD-10-CM | POA: Diagnosis present

## 2023-10-27 DIAGNOSIS — Z5982 Transportation insecurity: Secondary | ICD-10-CM

## 2023-10-27 DIAGNOSIS — Z91199 Patient's noncompliance with other medical treatment and regimen due to unspecified reason: Secondary | ICD-10-CM

## 2023-10-27 DIAGNOSIS — Z5941 Food insecurity: Secondary | ICD-10-CM | POA: Diagnosis not present

## 2023-10-27 DIAGNOSIS — F17290 Nicotine dependence, other tobacco product, uncomplicated: Secondary | ICD-10-CM | POA: Diagnosis present

## 2023-10-27 DIAGNOSIS — F332 Major depressive disorder, recurrent severe without psychotic features: Secondary | ICD-10-CM | POA: Diagnosis present

## 2023-10-27 DIAGNOSIS — F17210 Nicotine dependence, cigarettes, uncomplicated: Secondary | ICD-10-CM | POA: Diagnosis present

## 2023-10-27 DIAGNOSIS — F333 Major depressive disorder, recurrent, severe with psychotic symptoms: Secondary | ICD-10-CM | POA: Diagnosis not present

## 2023-10-27 DIAGNOSIS — F23 Brief psychotic disorder: Secondary | ICD-10-CM | POA: Diagnosis not present

## 2023-10-27 MED ORDER — DIPHENHYDRAMINE HCL 25 MG PO CAPS
50.0000 mg | ORAL_CAPSULE | Freq: Three times a day (TID) | ORAL | Status: DC | PRN
  Administered 2023-10-30: 50 mg via ORAL
  Filled 2023-10-27: qty 2

## 2023-10-27 MED ORDER — HALOPERIDOL LACTATE 5 MG/ML IJ SOLN
5.0000 mg | Freq: Three times a day (TID) | INTRAMUSCULAR | Status: DC | PRN
  Filled 2023-10-27: qty 1

## 2023-10-27 MED ORDER — HALOPERIDOL 5 MG PO TABS
5.0000 mg | ORAL_TABLET | Freq: Three times a day (TID) | ORAL | Status: DC | PRN
  Administered 2023-10-30: 5 mg via ORAL
  Filled 2023-10-27: qty 1

## 2023-10-27 MED ORDER — MAGNESIUM HYDROXIDE 400 MG/5ML PO SUSP: 30.0000 mL | Freq: Every day | ORAL | Status: DC | PRN

## 2023-10-27 MED ORDER — LORAZEPAM 2 MG/ML IJ SOLN
2.0000 mg | Freq: Three times a day (TID) | INTRAMUSCULAR | Status: DC | PRN
  Filled 2023-10-27: qty 1

## 2023-10-27 MED ORDER — LORAZEPAM 1 MG PO TABS
2.0000 mg | ORAL_TABLET | Freq: Three times a day (TID) | ORAL | Status: DC | PRN
  Administered 2023-10-30: 2 mg via ORAL
  Filled 2023-10-27: qty 2

## 2023-10-27 MED ORDER — DIPHENHYDRAMINE HCL 50 MG/ML IJ SOLN
50.0000 mg | Freq: Three times a day (TID) | INTRAMUSCULAR | Status: DC | PRN
  Filled 2023-10-27: qty 1

## 2023-10-27 MED ORDER — NICOTINE POLACRILEX 2 MG MT GUM
2.0000 mg | CHEWING_GUM | OROMUCOSAL | Status: DC | PRN
  Administered 2023-10-27 – 2023-11-06 (×45): 2 mg via ORAL
  Filled 2023-10-27 (×29): qty 1

## 2023-10-27 MED ORDER — ALUM & MAG HYDROXIDE-SIMETH 200-200-20 MG/5ML PO SUSP: 30.0000 mL | ORAL | Status: DC | PRN

## 2023-10-27 MED ORDER — ACETAMINOPHEN 325 MG PO TABS
650.0000 mg | ORAL_TABLET | Freq: Four times a day (QID) | ORAL | Status: DC | PRN
  Administered 2023-10-31 – 2023-11-05 (×7): 650 mg via ORAL
  Filled 2023-10-27 (×7): qty 2

## 2023-10-27 NOTE — [facility] Group Notes (Signed)
Adult Psychoeducational Group Note  Date:  10/27/2023 Time:  8:28 PM  Group Topic/Focus:  Wrap-Up Group:   The focus of this group is to help patients review their daily goal of treatment and discuss progress on daily workbooks.  Participation Level:  Active  Participation Quality:  Appropriate  Affect:  Flat  Cognitive:  Appropriate  Insight: Appropriate  Engagement in Group:  Engaged  Modes of Intervention:  Discussion  Additional Comments:   Today is pts first day on the unit. Pt states that he's had an "alright" day but has spent most of his time in the ED. Pt states he's wanting to go to school and play football  Vevelyn Pat 10/27/2023, 8:28 PM

## 2023-10-27 NOTE — ED Notes (Signed)
Mother called and requested update. Update received .

## 2023-10-27 NOTE — ED Notes (Signed)
Finished EKG, per Diplomatic Services operational officer is faxing

## 2023-10-27 NOTE — Progress Notes (Signed)
   10/27/23 2000  Psych Admission Type (Psych Patients Only)  Admission Status Involuntary  Psychosocial Assessment  Patient Complaints Worrying;Anxiety  Eye Contact Fair  Facial Expression Flat  Affect Depressed  Speech Soft;Slow  Interaction Cautious;Guarded  Motor Activity Slow  Appearance/Hygiene In scrubs  Behavior Characteristics Cooperative  Mood Depressed;Sad  Aggressive Behavior  Effect No apparent injury  Thought Process  Coherency Tangential  Content Blaming others  Delusions Paranoid  Perception WDL  Hallucination None reported or observed  Judgment WDL  Confusion None  Danger to Self  Current suicidal ideation? Denies

## 2023-10-27 NOTE — Progress Notes (Signed)
Pt has been accepted to [facility] on 10/27/2023 pending EKG IVC. Bed assignment: 500-1  Pt meets inpatient criteria per Fransisca Kaufmann NP   Attending Physician will be Dr. Ancil Linsey   Report can be called to: Adult unit: [phone]  Pt can arrive after IVC, EKG   Care Team Notified: [facility] Rona Ravens RN, [place] Hendra LCSW, Iona Coach RN, Venda Rodes [facility], Edythe Clarity RN, Fransisca Kaufmann NP,  Malva Limes RN   [place] Burl Tauzin LCSW-A   10/27/2023 10:05 AM
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
## 2023-10-27 NOTE — ED Notes (Signed)
Pt will go to hall 500 bed 1, Report given to  [place], RN [address], [place], [place]

## 2023-10-27 NOTE — Tx Team (Signed)
Initial Treatment Plan 10/27/2023 6:53 PM Omar Owen [id number]    PATIENT STRESSORS: Marital or family conflict   Medication change or noncompliance     PATIENT STRENGTHS: General fund of knowledge  Motivation for treatment/growth    PATIENT IDENTIFIED PROBLEMS:   Schizophrenia/ Paranoia    Medication noncompliance    Breaking objects in home (TV)    Feels like someone is "watching him"    Argument with mom and dad   DISCHARGE CRITERIA:  Improved stabilization in mood, thinking, and/or behavior  PRELIMINARY DISCHARGE PLAN: Outpatient therapy Placement in alternative living arrangements  PATIENT/FAMILY INVOLVEMENT: This treatment plan has been presented to and reviewed with the patient, [name]. The patient has been given the opportunity to ask questions and make suggestions.  Earma Reading Roneshia Drew, RN 10/27/2023, 6:53 PM

## 2023-10-27 NOTE — Plan of Care (Signed)
  Problem: Education: Goal: Knowledge of [facility] Education information/materials will improve Outcome: Progressing Goal: Verbalization of understanding the information provided will improve Outcome: Progressing   

## 2023-10-27 NOTE — Plan of Care (Signed)
  Problem: Coping: Goal: Ability to demonstrate self-control will improve Outcome: Progressing   Problem: Education: Goal: Emotional status will improve Outcome: Not Progressing Goal: Mental status will improve Outcome: Not Progressing   Problem: Activity: Goal: Interest or engagement in activities will improve Outcome: Not Progressing

## 2023-10-28 DIAGNOSIS — F333 Major depressive disorder, recurrent, severe with psychotic symptoms: Secondary | ICD-10-CM | POA: Diagnosis not present

## 2023-10-28 LAB — BASIC METABOLIC PANEL
Anion gap: 10 (ref 5–15)
BUN: 10 mg/dL (ref 6–20)
CO2: 28 mmol/L (ref 22–32)
Calcium: 9.7 mg/dL (ref 8.9–10.3)
Chloride: 102 mmol/L (ref 98–111)
Creatinine, Ser: 0.92 mg/dL (ref 0.61–1.24)
GFR, Estimated: >60 mL/min (ref >60)
Glucose, Bld: 73 mg/dL (ref 70–99)
Potassium: 3.9 mmol/L (ref 3.5–5.1)
Sodium: 140 mmol/L (ref 135–145)

## 2023-10-28 MED ORDER — HYDROXYZINE HCL 25 MG PO TABS
25.0000 mg | ORAL_TABLET | Freq: Three times a day (TID) | ORAL | Status: DC | PRN
  Administered 2023-10-31: 25 mg via ORAL
  Filled 2023-10-28: qty 1
  Filled 2023-10-28 (×2): qty 20

## 2023-10-28 MED ORDER — SERTRALINE HCL 25 MG PO TABS
25.0000 mg | ORAL_TABLET | Freq: Every day | ORAL | Status: DC
  Administered 2023-10-28 – 2023-11-04 (×8): 25 mg via ORAL
  Filled 2023-10-28 (×12): qty 1

## 2023-10-28 MED ORDER — TRAZODONE HCL 50 MG PO TABS
50.0000 mg | ORAL_TABLET | Freq: Every evening | ORAL | Status: DC | PRN
  Administered 2023-11-01: 50 mg via ORAL
  Filled 2023-10-28 (×2): qty 1

## 2023-10-28 MED ORDER — OLANZAPINE 5 MG PO TBDP
5.0000 mg | ORAL_TABLET | Freq: Every day | ORAL | Status: DC
  Administered 2023-10-28 – 2023-10-31 (×4): 5 mg via ORAL
  Filled 2023-10-28 (×6): qty 1

## 2023-10-28 NOTE — [facility] Suicide Risk Assessment (Signed)
Suicide Risk Assessment  Admission Assessment    [facility] Admission Suicide Risk Assessment  Nursing information obtained from:  Patient Demographic factors:  Unemployed Current Mental Status:  NA Loss Factors:  Financial problems / change in socioeconomic status Historical Factors:  NA Risk Reduction Factors:  NA  Total Time spent with patient: 30 minutes Principal Problem: MDD (major depressive disorder), recurrent episode, severe (HCC) Diagnosis:  Principal Problem:   MDD (major depressive disorder), recurrent episode, severe (HCC)  Subjective Data: Omar Owen is a 22 year old African-American male with prior psychiatric history of depression and ADHD who was admitted involuntarily to Adult [facility] from [facility] health ED at [facility] for worsening depression with paranoia in the context of not taking his prescribed psychotropic medications. After medical evaluation/stabilization & clearance, he was transferred to the [facility] Adult unit for further psychiatric evaluation & treatments.   Continued Clinical Symptoms:  Alcohol Use Disorder Identification Test Final Score (AUDIT): 0 The "Alcohol Use Disorders Identification Test", Guidelines for Use in Primary Care, Second Edition.  World Science writer [facility]). Score between 0-7:  no or low risk or alcohol related problems. Score between 8-15:  moderate risk of alcohol related problems. Score between 16-19:  high risk of alcohol related problems. Score 20 or above:  warrants further diagnostic evaluation for alcohol dependence and treatment.  CLINICAL FACTORS:   Severe Anxiety and/or Agitation Depression:   Aggression Anhedonia Comorbid alcohol abuse/dependence Delusional Hopelessness Impulsivity Severe Alcohol/Substance Abuse/Dependencies More than one psychiatric diagnosis Unstable or Poor Therapeutic Relationship Previous Psychiatric Diagnoses and Treatments Medical Diagnoses and  Treatments/Surgeries  Musculoskeletal: Strength & Muscle Tone: within normal limits Gait & Station: normal Patient leans: N/A  Psychiatric Specialty Exam:  Presentation  General Appearance:  Casual; Fairly Groomed  Eye Contact: Fair  Speech: Clear and Coherent  Speech Volume: Normal  Handedness: Right  Mood and Affect  Mood: Anxious; Depressed  Affect: Congruent   Thought Process  Thought Processes: Coherent; Linear  Descriptions of Associations:Tangential  Orientation:Full (Time, Place and Person)  Thought Content:Rumination; Delusions; Paranoid Ideation  History of Schizophrenia/Schizoaffective disorder:No  Duration of Psychotic Symptoms:Less than six months  Hallucinations:Hallucinations: None  Ideas of Reference:Paranoia; Delusions  Suicidal Thoughts:Suicidal Thoughts: No  Homicidal Thoughts:Homicidal Thoughts: No  Sensorium  Memory: Immediate Fair; Recent Fair  Judgment: Poor  Insight: Poor  Executive Functions  Concentration: Fair  Attention Span: Fair  Recall: [employer] of Knowledge: Fair  Language: Fair  Psychomotor Activity  Psychomotor Activity: Psychomotor Activity: Normal  Assets  Assets: Communication Skills; Desire for Improvement; Housing; Physical Health; Resilience; Social Support  Sleep  Sleep: Sleep: Good Number of Hours of Sleep: 8  Physical Exam: Physical Exam Vitals and nursing note reviewed.  Constitutional:      General: He is not in acute distress.    Appearance: He is not toxic-appearing.  HENT:     Head: Normocephalic.     Nose: Nose normal.     Mouth/Throat:     Mouth: Mucous membranes are moist.  Eyes:     Extraocular Movements: Extraocular movements intact.  Cardiovascular:     Rate and Rhythm: Normal rate.     Pulses: Normal pulses.  Pulmonary:     Effort: Pulmonary effort is normal.  Abdominal:     Comments: Deferred  Genitourinary:    Comments:  Deferred Musculoskeletal:        General: Normal range of motion.     Cervical back: Normal range of motion.  Skin:    General:  Skin is warm.  Neurological:     General: No focal deficit present.     Mental Status: He is alert and oriented to person, place, and time.  Psychiatric:        Mood and Affect: Mood normal.        Behavior: Behavior normal.    Review of Systems  Constitutional:  Negative for chills and fever.  HENT:  Negative for sore throat.   Eyes:  Negative for blurred vision and double vision.  Respiratory:  Negative for cough, sputum production, shortness of breath and wheezing.   Cardiovascular:  Negative for chest pain and palpitations.  Gastrointestinal:  Negative for abdominal pain, constipation, diarrhea, heartburn, nausea and vomiting.  Genitourinary:  Negative for dysuria, frequency and urgency.  Musculoskeletal: Negative.   Skin:  Negative for itching and rash.  Neurological:  Negative for dizziness, tingling, tremors, sensory change, speech change, focal weakness, seizures and headaches.  Endo/Heme/Allergies:        See allergy listing  Psychiatric/Behavioral:  Positive for depression and substance abuse. The patient is nervous/anxious.    Blood pressure 104/74, pulse 88, temperature 98.2 F (36.8 C), temperature source Oral, resp. rate 12, height 6' (1.829 m), weight 62.6 kg, SpO2 99%. Body mass index is 18.72 kg/m.  COGNITIVE FEATURES THAT CONTRIBUTE TO RISK:  Polarized thinking    SUICIDE RISK:   Severe:  Frequent, intense, and enduring suicidal ideation, specific plan, no subjective intent, but some objective markers of intent (i.e., choice of lethal method), the method is accessible, some limited preparatory behavior, evidence of impaired self-control, severe dysphoria/symptomatology, multiple risk factors present, and few if any protective factors, particularly a lack of social support.  PLAN OF CARE:Physician Treatment Plan for Primary  Diagnosis: Assessment:  MDD (major depressive disorder), recurrent episode, severe (HCC) Plan: Medications: Initiate Sertraline tablet 25 mg p.o. daily for depression Initiate Olanzapine tablet 5 mg p.o. daily at bedtime for psychosis  Initiate Trazodone tablet 50 mg p.o. daily nightly as needed for insomnia Initiated Hydroxyzine 25 mg tab p.o. 3 times daily as needed for anxiety  Agitation protocol: Benadryl capsule 50 mg p.o. or IM 3 times daily as needed agitation   Haldol tablets 5 mg po IM 3 times daily as needed agitation   Lorazepam tablet 2 mg p.o. or IM 3 times daily as needed agitation    Other PRN Medications -Acetaminophen 650 mg every 6 as needed/mild pain -Maalox 30 mL oral every 4 as needed/digestion -Magnesium hydroxide 30 mL daily as needed/mild constipation  -- The risks/benefits/side-effects/alternatives to this medication were discussed in detail with the patient and time was given for questions. The patient consents to medication trial.  -- Metabolic profile and EKG monitoring obtained while on an atypical antipsychotic (BMI: Lipid Panel: HbgA1c: QTc:)  -- Encouraged patient to participate in unit milieu and in scheduled group therapies   Labs reviewed: CMP: Potassium 3.4, replaced with 40 mEq of potassium x 1 only, glucose 110.  Otherwise normal.  CBC: WNL.  Urine toxicology: Positive for marijuana  Labs ordered: BMP in a.m. 10/29/23, lipid panel, TSH, hemoglobin A1c, vitamin D 25-hydroxy.  EKG reviewed: Last EKG on that June 2024: NSR, vent rate 94, QT/QTc 332/415.  New EKG ordered   Safety and Monitoring: Voluntary admission to inpatient psychiatric unit for safety, stabilization and treatment Daily contact with patient to assess and evaluate symptoms and progress in treatment Patient's case to be discussed in multi-disciplinary team meeting Observation Level : q15 minute checks Vital  signs: q12 hours Precautions: suicide, but pt currently verbally  contracts for safety on unit    Discharge Planning: Social work and case management to assist with discharge planning and identification of hospital follow-up needs prior to discharge Estimated LOS: 5-7 days Discharge Concerns: Need to establish a safety plan; Medication compliance and effectiveness Discharge Goals: Return home with outpatient referrals for mental health follow-up including medication management/psychotherapy.  Long Term Goal(s): Improvement in symptoms so as ready for discharge  Short Term Goals: Ability to identify changes in lifestyle to reduce recurrence of condition will improve, Ability to verbalize feelings will improve, Ability to disclose and discuss suicidal ideas, Ability to demonstrate self-control will improve, Ability to identify and develop effective coping behaviors will improve, Ability to maintain clinical measurements within normal limits will improve, Compliance with prescribed medications will improve, and Ability to identify triggers associated with substance abuse/mental health issues will improve  Physician Treatment Plan for Secondary Diagnosis: Principal Problem:   MDD (major depressive disorder), recurrent episode, severe (HCC)  I certify that inpatient services furnished can reasonably be expected to improve the patient's condition.   Cecilie Lowers, FNP 10/28/2023, 3:39 PM

## 2023-10-28 NOTE — Plan of Care (Signed)
  Problem: Education: Goal: Knowledge of [facility] Education information/materials will improve Outcome: Progressing Goal: Emotional status will improve Outcome: Progressing   

## 2023-10-28 NOTE — Progress Notes (Addendum)
D. Pt presents quiet and guarded- minimal during interactions. Pt denied SI/HI and A/VH, and paranoia, reported that he didn't know why he was here. Pt has been visible in milieu, observed attending group.  A. Labs and vitals monitored.  Pt supported emotionally and encouraged to express concerns and ask questions.   R. Pt remains safe with 15 minute checks. Will continue POC.    10/28/23 0900  Psych Admission Type (Psych Patients Only)  Admission Status Involuntary  Psychosocial Assessment  Patient Complaints Anxiety  Eye Contact Fair  Facial Expression Flat  Affect Sad;Preoccupied  Speech Soft  Interaction Cautious  Motor Activity Slow  Appearance/Hygiene In scrubs  Behavior Characteristics Cooperative;Calm  Mood Sad;Preoccupied  Aggressive Behavior  Effect No apparent injury  Thought Process  Coherency WDL  Content WDL  Delusions None reported or observed  Perception WDL  Hallucination None reported or observed  Judgment Poor  Confusion None  Danger to Self  Current suicidal ideation? Denies  Danger to Others  Danger to Others None reported or observed

## 2023-10-28 NOTE — Group Note (Signed)
Recreation Therapy Group Note   Group Topic:Team Building  Group Date: 10/28/2023 Start Time: 1010 End Time: 1035 Facilitators: Jabree Pernice-McCall, LRT,CTRS Location: [address] Dayroom   Group Topic: Communication, Team Building, Problem Solving  Goal Area(s) Addresses:  Patient will effectively work with peer towards shared goal.  Patient will identify skills used to make activity successful.  Patient will identify how skills used during activity can be used to reach post d/c goals.   Behavioral Response: Engaged, Attentive, Appropriate   Intervention: Teambuilding Activity  Group Description: [place]. Working in teams, patients were asked to use colored discs to get the entire team from one end of the hall way to the other. Patients were only allowed to move down and back the hallway by stepping on the discs, patient teams were provided 1 additional disc to assist with them completing task.    Education: Pharmacist, community, Scientist, physiological, Discharge Planning   Education Outcome: Acknowledges education.    Affect/Mood: Appropriate   Participation Level: Engaged   Participation Quality: Independent   Behavior: Appropriate   Speech/Thought Process: Focused   Insight: Good   Judgement: Good   Modes of Intervention: Team-building   Patient Response to Interventions:  Engaged   Education Outcome:  In group clarification offered    Clinical Observations/Individualized Feedback: Pt was bright and engaged. Pt worked well with peers. Pt was also focused on doing his part to make sure the group was successful.    Plan: Continue to engage patient in RT group sessions 2-3x/week.   Jenavieve Freda-McCall, LRT,CTRS 10/28/2023 11:47 AM

## 2023-10-28 NOTE — Progress Notes (Signed)
   10/28/23 2100  Psych Admission Type (Psych Patients Only)  Admission Status Involuntary  Psychosocial Assessment  Patient Complaints Anxiety  Eye Contact Fair  Facial Expression Flat  Affect Appropriate to circumstance  Speech Logical/coherent  Interaction Assertive  Motor Activity Slow  Appearance/Hygiene In scrubs  Behavior Characteristics Appropriate to situation;Cooperative  Mood Pleasant  Thought Process  Coherency WDL  Content WDL  Delusions None reported or observed  Perception WDL  Hallucination None reported or observed  Judgment Poor  Confusion None  Danger to Self  Current suicidal ideation? Denies  Danger to Others  Danger to Others None reported or observed

## 2023-10-28 NOTE — Progress Notes (Signed)
Recreation Therapy Notes  Patient admitted to unit 10.28.24. Due to admission within last year, no new recreation therapy assessment conducted at this time. Last assessment conducted on 6.10.24.    Reason for current admission per patient, "reacting to the truth".  Patient reports "clothes being switched around" as stressor for current admission.  Patient reports coping skills and leisure interests as being the same.  Patient reports goal of "getting out".  Patient denies SI, HI, AVH at this time.    Information found below from assessment conducted 6.10.24.   Coping Skills: Isolation, Sports, TV, Music, Exercise, Deep Breathing, Meditate, Talk, Art, Prayer, Avoidance, Read, Hot Bath/Shower  Leisure Interests: Party/Have fun; Ride Dirt Bikes  Patient Strengths: Working out, good at finding missing pieces to things like puzzles  Area of Improvement: None    Omar Owen, LRT,CTRS Seaton Hofmann A Dorothe Elmore-McCall 10/28/2023 12:54 PM

## 2023-10-28 NOTE — Group Note (Signed)
Date:  10/28/2023 Time:  9:14 AM  Group Topic/Focus:  Goals Group:   The focus of this group is to help patients establish daily goals to achieve during treatment and discuss how the patient can incorporate goal setting into their daily lives to aide in recovery.    Participation Level:  Active  Participation Quality:  Appropriate  Affect:  Appropriate  Cognitive:  Appropriate  Insight: Appropriate  Engagement in Group:  Engaged  Modes of Intervention:  Clarification  Additional Comments:  Pt goal: Stay calm  Omar Owen 10/28/2023, 9:14 AM

## 2023-10-28 NOTE — Group Note (Signed)
=  Date:  10/28/2023 Time:  11:15 PM  Group Topic/Focus:  Wrap-Up Group:   The focus of this group is to help patients review their daily goal of treatment and discuss progress on daily workbooks.    Participation Level:  Active  Participation Quality:  Appropriate and Attentive  Affect:  Appropriate  Cognitive:  Alert and Appropriate  Insight: Lacking  Engagement in Group:  Engaged  Modes of Intervention:  Discussion and Education  Additional Comments:  Pt attended and participated in wrap up group this evening and rated their day a 9/10. Pt stated that they went outside as well as groups on the unit. Pt goal while they are here is to speak with the SW about resources for school. Pt has no complaints at this time.   Chrisandra Netters 10/28/2023, 11:15 PM
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
## 2023-10-29 ENCOUNTER — Encounter (HOSPITAL_COMMUNITY): Payer: Self-pay

## 2023-10-29 DIAGNOSIS — F333 Major depressive disorder, recurrent, severe with psychotic symptoms: Secondary | ICD-10-CM | POA: Diagnosis not present

## 2023-10-29 LAB — HEMOGLOBIN A1C
Hgb A1c MFr Bld: 5.3 % (ref 4.8–5.6)
Mean Plasma Glucose: 105.41 mg/dL

## 2023-10-29 LAB — LIPID PANEL
Cholesterol: 140 mg/dL (ref 0–200)
HDL: 46 mg/dL (ref >40)
LDL Cholesterol: 83 mg/dL (ref 0–99)
Total CHOL/HDL Ratio: 3 {ratio}
Triglycerides: 54 mg/dL (ref <150)
VLDL: 11 mg/dL (ref 0–40)

## 2023-10-29 LAB — TSH: TSH: 2 u[IU]/mL (ref 0.350–4.500)

## 2023-10-29 LAB — VITAMIN D 25 HYDROXY (VIT D DEFICIENCY, FRACTURES): Vit D, 25-Hydroxy: 27.98 ng/mL — ABNORMAL LOW (ref 30–100)

## 2023-10-29 NOTE — BH IP Treatment Plan (Signed)
Interdisciplinary Treatment and Diagnostic Plan Initial  10/29/2023 Time of Session: [name] MRN: [id number]  Principal Diagnosis: MDD (major depressive disorder), recurrent episode, severe (HCC)  Secondary Diagnoses: Principal Problem:   MDD (major depressive disorder), recurrent episode, severe (HCC)   Current Medications:  Current Facility-Administered Medications  Medication Dose Route Frequency Provider Last Rate Last Admin   acetaminophen (TYLENOL) tablet 650 mg  650 mg Oral Q6H PRN Starkes-Perry, Juel Burrow, FNP       alum & mag hydroxide-simeth (MAALOX/MYLANTA) 200-200-20 MG/5ML suspension 30 mL  30 mL Oral Q4H PRN Starkes-Perry, Juel Burrow, FNP       diphenhydrAMINE (BENADRYL) capsule 50 mg  50 mg Oral TID PRN Maryagnes Amos, FNP       Or   diphenhydrAMINE (BENADRYL) injection 50 mg  50 mg Intramuscular TID PRN Starkes-Perry, Juel Burrow, FNP       haloperidol (HALDOL) tablet 5 mg  5 mg Oral TID PRN Maryagnes Amos, FNP       Or   haloperidol lactate (HALDOL) injection 5 mg  5 mg Intramuscular TID PRN Starkes-Perry, Juel Burrow, FNP       hydrOXYzine (ATARAX) tablet 25 mg  25 mg Oral TID PRN Ntuen, Jesusita Oka, FNP       LORazepam (ATIVAN) tablet 2 mg  2 mg Oral TID PRN Starkes-Perry, Juel Burrow, FNP       Or   LORazepam (ATIVAN) injection 2 mg  2 mg Intramuscular TID PRN Starkes-Perry, Juel Burrow, FNP       magnesium hydroxide (MILK OF MAGNESIA) suspension 30 mL  30 mL Oral Daily PRN Starkes-Perry, Juel Burrow, FNP       nicotine polacrilex (NICORETTE) gum 2 mg  2 mg Oral PRN Rex Kras, MD   2 mg at 10/29/23 1247   OLANZapine zydis (ZYPREXA) disintegrating tablet 5 mg  5 mg Oral QHS Ntuen, Tina C, FNP   5 mg at 10/28/23 2101   sertraline (ZOLOFT) tablet 25 mg  25 mg Oral Daily Ntuen, Jesusita Oka, FNP   25 mg at 10/29/23 2956   traZODone (DESYREL) tablet 50 mg  50 mg Oral QHS PRN Ntuen, Jesusita Oka, FNP       PTA Medications: Medications Prior to Admission  Medication Sig  Dispense Refill Last Dose   escitalopram (LEXAPRO) 5 MG tablet Take 5 mg by mouth daily.       Patient Stressors: Marital or family conflict   Medication change or noncompliance    Patient Strengths: Automotive engineer for treatment/growth   Treatment Modalities: Medication Management, Group therapy, Case management,  1 to 1 session with clinician, Psychoeducation, Recreational therapy.   Physician Treatment Plan for Primary Diagnosis: MDD (major depressive disorder), recurrent episode, severe (HCC) Long Term Goal(s): Improvement in symptoms so as ready for discharge   Short Term Goals: Ability to identify changes in lifestyle to reduce recurrence of condition will improve Ability to verbalize feelings will improve Ability to disclose and discuss suicidal ideas Ability to demonstrate self-control will improve Ability to identify and develop effective coping behaviors will improve Ability to maintain clinical measurements within normal limits will improve Compliance with prescribed medications will improve Ability to identify triggers associated with substance abuse/mental health issues will improve  Medication Management: Evaluate patient's response, side effects, and tolerance of medication regimen.  Therapeutic Interventions: 1 to 1 sessions, Unit Group sessions and Medication administration.  Evaluation of Outcomes: Progressing  Physician Treatment Plan for Secondary Diagnosis:  Principal Problem:   MDD (major depressive disorder), recurrent episode, severe (HCC)  Long Term Goal(s): Improvement in symptoms so as ready for discharge   Short Term Goals: Ability to identify changes in lifestyle to reduce recurrence of condition will improve Ability to verbalize feelings will improve Ability to disclose and discuss suicidal ideas Ability to demonstrate self-control will improve Ability to identify and develop effective coping behaviors will improve Ability  to maintain clinical measurements within normal limits will improve Compliance with prescribed medications will improve Ability to identify triggers associated with substance abuse/mental health issues will improve     Medication Management: Evaluate patient's response, side effects, and tolerance of medication regimen.  Therapeutic Interventions: 1 to 1 sessions, Unit Group sessions and Medication administration.  Evaluation of Outcomes: Progressing   RN Treatment Plan for Primary Diagnosis: MDD (major depressive disorder), recurrent episode, severe (HCC) Long Term Goal(s): Knowledge of disease and therapeutic regimen to maintain health will improve  Short Term Goals: Ability to remain free from injury will improve, Ability to verbalize frustration and anger appropriately will improve, Ability to demonstrate self-control, Ability to participate in decision making will improve, Ability to verbalize feelings will improve, Ability to disclose and discuss suicidal ideas, Ability to identify and develop effective coping behaviors will improve, and Compliance with prescribed medications will improve  Medication Management: RN will administer medications as ordered by provider, will assess and evaluate patient's response and provide education to patient for prescribed medication. RN will report any adverse and/or side effects to prescribing provider.  Therapeutic Interventions: 1 on 1 counseling sessions, Psychoeducation, Medication administration, Evaluate responses to treatment, Monitor vital signs and CBGs as ordered, Perform/monitor CIWA, COWS, AIMS and Fall Risk screenings as ordered, Perform wound care treatments as ordered.  Evaluation of Outcomes: Progressing   LCSW Treatment Plan for Primary Diagnosis: MDD (major depressive disorder), recurrent episode, severe (HCC) Long Term Goal(s): Safe transition to appropriate next level of care at discharge, Engage patient in therapeutic group  addressing interpersonal concerns.  Short Term Goals: Engage patient in aftercare planning with referrals and resources, Increase social support, Increase ability to appropriately verbalize feelings, Increase emotional regulation, Facilitate acceptance of mental health diagnosis and concerns, Facilitate patient progression through stages of change regarding substance use diagnoses and concerns, and Identify triggers associated with mental health/substance abuse issues  Therapeutic Interventions: Assess for all discharge needs, 1 to 1 time with Social worker, Explore available resources and support systems, Assess for adequacy in community support network, Educate family and significant other(s) on suicide prevention, Complete Psychosocial Assessment, Interpersonal group therapy.  Evaluation of Outcomes: Progressing   Progress in Treatment: Attending groups: Yes. Participating in groups: Yes. Taking medication as prescribed: Yes. Toleration medication: Yes. Family/Significant other contact made: No, will contact:  Consents pending Patient understands diagnosis: Yes. Discussing patient identified problems/goals with staff: Yes. Medical problems stabilized or resolved: Yes. Denies suicidal/homicidal ideation: Yes. Issues/concerns per patient self-inventory: Yes. Other: N/A  New problem(s) identified: No, Describe:  None reported  New Short Term/Long Term Goal(s):  Patient Goals:  Medication Stabilization  Discharge Plan or Barriers: CSW will continue to follow and assess for appropriate referrals and possible discharge planning.   Reason for Continuation of Hospitalization: Depression Suicidal ideation Withdrawal symptoms  Estimated Length of Stay: 5-7 Days  Last 3 [place] Suicide Severity Risk Score: Flowsheet Row Ad[facility] (Current) [facility] from 10[facility]26/2023 in [facility] Department at [facility] ED  from 09/15/2023  in [facility] Emergency Department at [facility]  C-SSRS RISK CATEGORY No Risk No Risk No Risk       Last PHQ 2/9 Scores:     No data to display         detox, medication management for mood stabilization; elimination of SI thoughts; development of comprehensive mental wellness/sobriety plan  Scribe for Treatment Team: Ane Payment, LCSW 10/29/2023 12:54 PM
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
## 2023-10-29 NOTE — Plan of Care (Signed)
  Problem: Education: Goal: Mental status will improve Outcome: Progressing Goal: Verbalization of understanding the information provided will improve Outcome: Progressing   Problem: Activity: Goal: Interest or engagement in activities will improve Outcome: Progressing   

## 2023-10-29 NOTE — Group Note (Signed)
Date:  10/29/2023 Time:  8:41 PM  Group Topic/Focus:  Wrap-Up Group:   The focus of this group is to help patients review their daily goal of treatment and discuss progress on daily workbooks.    Participation Level:  Active  Participation Quality:  Appropriate and Attentive  Affect:  Appropriate  Cognitive:  Alert and Appropriate  Insight: Appropriate and Good  Engagement in Group:  Engaged  Modes of Intervention:  Discussion and Education  Additional Comments:  Pt attended and participated in wrap up group this evening and rated their day a 10/10. Pt stated that they are learning more and they want to be D/C so that they can go out and do "positive things". Pt has a goal to eventually speak with their family and states that they completed their goal to speak with the SW about resources for school.   Chrisandra Netters 10/29/2023, 8:41 PM

## 2023-10-29 NOTE — Progress Notes (Signed)
   10/29/23 0815  Psych Admission Type (Psych Patients Only)  Admission Status Involuntary  Psychosocial Assessment  Patient Complaints Anxiety  Eye Contact Fair  Facial Expression Flat  Affect Preoccupied  Speech Logical/coherent  Interaction Minimal  Motor Activity Slow  Appearance/Hygiene In scrubs  Behavior Characteristics Anxious  Mood Preoccupied  Thought Process  Coherency WDL  Content WDL  Delusions None reported or observed  Perception WDL  Hallucination None reported or observed  Judgment Poor  Confusion None  Danger to Self  Current suicidal ideation? Denies  Danger to Others  Danger to Others None reported or observed
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
## 2023-10-29 NOTE — Group Note (Signed)
Recreation Therapy Group Note   Group Topic:Leisure Education  Group Date: 10/29/2023 Start Time: 1037 End Time: 1108 Facilitators: Selina Tapper-McCall, LRT,CTRS Location: [address] Dayroom   Group Topic: Leisure Education  Goal Area(s) Addresses:  Patient will identify positive leisure activities for use post discharge. Patient will identify at least one positive benefit of participation in leisure activities.   Intervention: Innovation, Group Presentation   Group Description: Keep It Contractor. Patients were placed in circle and given a beach ball. Patients were to hit the beach ball back and forth for as long as possible without it coming to a stop. While patients were hitting the ball, LRT kept timer of how long the ball was moving.  If the call came to a complete stop, the time would start over.  Education: Leisure Scientist, physiological, Special educational needs teacher, Teamwork, Discharge Planning  Education Outcome: Acknowledges education/In group clarification offered/Needs additional education.    Affect/Mood: Appropriate   Participation Level: Engaged   Participation Quality: Independent   Behavior: Appropriate   Speech/Thought Process: Focused   Insight: Good   Judgement: Good   Modes of Intervention: Cooperative Play   Patient Response to Interventions:  Engaged   Education Outcome:  In group clarification offered    Clinical Observations/Individualized Feedback: Pt was bright and engaged throughout activity. Pt worked well with peers in making sure the ball stayed in play. Pt was appropriate and focused.    Plan: Continue to engage patient in RT group sessions 2-3x/week.   Haeley Fordham-McCall, LRT,CTRS  10/29/2023 12:19 PM

## 2023-10-30 DIAGNOSIS — F333 Major depressive disorder, recurrent, severe with psychotic symptoms: Secondary | ICD-10-CM | POA: Diagnosis not present
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
## 2023-10-30 NOTE — Progress Notes (Signed)
   10/30/23 0005  Psych Admission Type (Psych Patients Only)  Admission Status Voluntary  Psychosocial Assessment  Patient Complaints Anxiety  Eye Contact Fair  Facial Expression Flat  Affect Preoccupied  Speech Logical/coherent  Interaction Minimal  Motor Activity Slow  Appearance/Hygiene In scrubs  Behavior Characteristics Anxious  Mood Preoccupied  Thought Process  Coherency WDL  Content WDL  Delusions None reported or observed  Perception WDL  Hallucination None reported or observed  Judgment Poor  Confusion None  Danger to Self  Current suicidal ideation? Denies  Danger to Others  Danger to Others None reported or observed

## 2023-10-30 NOTE — Progress Notes (Signed)
Adult Psychoeducational Group Note  Date:  10/30/2023 Time:  10:05 AM  Group Topic/Focus:  Goals Group:   The focus of this group is to help patients establish daily goals to achieve during treatment and discuss how the patient can incorporate goal setting into their daily lives to aide in recovery.  Participation Level:  Active  Participation Quality:  Appropriate  Affect:  Appropriate  Cognitive:  Appropriate  Insight: Appropriate  Engagement in Group:  Engaged  Modes of Intervention:  Discussion  Additional Comments: The patient  st goals in group.  [name] 10/30/2023, 10:05 AM

## 2023-10-30 NOTE — Group Note (Signed)
Recreation Therapy Group Note   Group Topic:Leisure Education  Group Date: 10/30/2023 Start Time: 1015 End Time: 1040 Facilitators: Dedrick Heffner-McCall, LRT,CTRS Location: [address] Dayroom   Group Topic: Leisure Education  Goal Area(s) Addresses:  Patient will successfully identify positive leisure and recreation activities.  Patient will acknowledge benefits of participation in healthy leisure activities post discharge.  Patient will actively work with peers toward a shared goal.   Intervention: Competitive Group Game    Group Description: IT trainer. In groups of 5-7, patients took turns trying to guess the picture being drawn on the board by their teammate.  If the team guessed the correct answer, they won a point.  If the team guessed wrong, the other team got a chance to steal the point. After several rounds of game play, the team with the most points were declared winners. Post-activity discussion reviewed benefits of positive recreation outlets: reducing stress, improving coping mechanisms, increasing self-esteem, and building larger support systems.   Education:  Teacher, English as a foreign language, Leisure as Merchant navy officer, Programmer, applications, Building control surveyor   Education Outcome: Acknowledges education/In group clarification offered/Needs additional education   Affect/Mood: Appropriate   Participation Level: Active   Participation Quality: Independent   Behavior: Attentive    Speech/Thought Process: Directed   Insight: Moderate   Judgement: Moderate   Modes of Intervention: Competitive Play   Patient Response to Interventions:  Attentive and Receptive   Education Outcome:  In group clarification offered    Clinical Observations/Individualized Feedback: Pt was quiet for the most part. Pt would look out the window at the people outside at times during group. Pt would make some guesses but was more observant.       Plan: Continue to engage patient in RT group sessions  2-3x/week.   Shela Esses-McCall, LRT,CTRS 10/30/2023 11:23 AM

## 2023-10-30 NOTE — Group Note (Signed)
Date:  10/30/2023 Time:  10:18 PM  Group Topic/Focus:  Wrap-Up Group:   The focus of this group is to help patients review their daily goal of treatment and discuss progress on daily workbooks.    Participation Level:  Did Not Attend  Participation Quality:   N/A  Affect:   N/A  Cognitive:   N/A  Insight: None  Engagement in Group:   N/A  Modes of Intervention:   N/A  Additional Comments:  Patient did not attend wrap up group.  Omar Owen 10/30/2023, 10:18 PM

## 2023-10-30 NOTE — Group Note (Signed)
Occupational Therapy Group Note  Group Topic: Sleep Hygiene  Group Date: 10/30/2023 Start Time: 1430 End Time: 1500 Facilitators: Ted Mcalpine, OT   Group Description: Group encouraged increased participation and engagement through topic focused on sleep hygiene. Patients reflected on the quality of sleep they typically receive and identified areas that need improvement. Group was given background information on sleep and sleep hygiene, including common sleep disorders. Group members also received information on how to improve one's sleep and introduced a sleep diary as a tool that can be utilized to track sleep quality over a length of time. Group session ended with patients identifying one or more strategies they could utilize or implement into their sleep routine in order to improve overall sleep quality.        Therapeutic Goal(s):  Identify one or more strategies to improve overall sleep hygiene  Identify one or more areas of sleep that are negatively impacted (sleep too much, too little, etc)     Participation Level: Engaged   Participation Quality: Independent   Behavior: Appropriate   Speech/Thought Process: Relevant   Affect/Mood: Appropriate   Insight: Fair   Judgement: Fair      Modes of Intervention: Education  Patient Response to Interventions:  Attentive   Plan: Continue to engage patient in OT groups 2 - 3x/week.  10/30/2023  Ted Mcalpine, OT   Kerrin Champagne, OT
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
## 2023-10-30 NOTE — [facility] Suicide Risk Assessment (Signed)
[facility] INPATIENT:  Family/Significant Other Suicide Prevention Education  Suicide Prevention Education:  Education Completed; 10-30-2023, Alinda Money -[phone] has been identified by the patient as the family member/significant other with whom the patient will be residing, and identified as the person(s) who will aid the patient in the event of a mental health crisis (suicidal ideations/suicide attempt).  With written consent from the patient, Alinda Money [phone] other has been provided the following suicide prevention education, prior to the and/or following the discharge of the patient.  The suicide prevention education provided includes the following: Suicide risk factors Suicide prevention and interventions National Suicide Hotline telephone number [facility] assessment telephone number [facility] Emergency Assistance 911 [facility] and/or Residential Mobile Crisis Unit telephone number  Request made of family/significant other to: Remove weapons (e.g., guns, rifles, knives), all items previously/currently identified as safety concern.   Remove drugs/medications (over-the-counter, prescriptions, illicit drugs), all items previously/currently identified as a safety concern.  Alinda Money -[phone] verbalizes understanding of the suicide prevention education information provided.  The family member/significant other agrees to remove the items of safety concern listed above.  Zareth Rippetoe S Ayomide Purdy 10/30/2023, 1:10 PM

## 2023-10-30 NOTE — Progress Notes (Signed)
   10/30/23 2200  Psych Admission Type (Psych Patients Only)  Admission Status Voluntary  Psychosocial Assessment  Patient Complaints Anxiety  Eye Contact Fair  Facial Expression Flat  Affect Preoccupied  Speech Logical/coherent  Interaction Minimal  Motor Activity Slow  Appearance/Hygiene In scrubs  Behavior Characteristics Appropriate to situation  Mood Pleasant  Thought Process  Coherency WDL  Content WDL  Delusions None reported or observed  Perception WDL  Hallucination None reported or observed  Judgment Poor  Confusion None  Danger to Self  Current suicidal ideation? Denies  Danger to Others  Danger to Others None reported or observed

## 2023-10-30 NOTE — Plan of Care (Signed)
  Problem: Health Behavior/Discharge Planning: Goal: Identification of resources available to assist in meeting health care needs will improve 10/30/2023 0644 by Lahoma Crocker, RN Outcome: Progressing 10/30/2023 0643 by Lahoma Crocker, RN Outcome: Progressing Goal: Compliance with treatment plan for underlying cause of condition will improve 10/30/2023 0644 by Lahoma Crocker, RN Outcome: Progressing 10/30/2023 0643 by Lahoma Crocker, RN Outcome: Progressing   Problem: Physical Regulation: Goal: Ability to maintain clinical measurements within normal limits will improve Outcome: Progressing

## 2023-10-31 DIAGNOSIS — F23 Brief psychotic disorder: Secondary | ICD-10-CM
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
## 2023-10-31 NOTE — B[facility] Group Notes (Signed)
Spirituality group facilitated by Kathleen Argue, BCC.  Group Description: Group focused on topic of community. Patients participated in facilitated discussion around topic, connecting with one another around experiences and definitions for community. Group members engaged with visual explorer photos, reflecting on what community looks like for them today. Group engaged in discussion around how their definitions of community are present today in hospital.  Modalities: Psycho-social ed, Adlerian, Narrative, MI  Patient Progress: Omar Owen attended group.  Though verbal participation was minimal, he demonstrated engagement in the conversation.

## 2023-10-31 NOTE — Progress Notes (Signed)
   10/31/23 1100  Psych Admission Type (Psych Patients Only)  Admission Status Voluntary  Psychosocial Assessment  Patient Complaints Anxiety  Eye Contact Fair  Facial Expression Flat  Affect Preoccupied  Speech Logical/coherent  Interaction Minimal  Motor Activity Slow  Appearance/Hygiene In scrubs  Behavior Characteristics Appropriate to situation  Mood Pleasant  Thought Process  Coherency WDL  Content WDL  Delusions None reported or observed  Perception WDL  Hallucination None reported or observed  Judgment Poor  Confusion None  Danger to Self  Current suicidal ideation? Denies  Danger to Others  Danger to Others None reported or observed

## 2023-10-31 NOTE — Progress Notes (Signed)
Pt is minimal and guarded. Pt was irritable some because he feels like other people don't understand his situation and therefore, his situation isn't going to change. Discussed ways pt can prepare for discharge and provided him a suicide safety plan to work on. Pt's mood slowly brightened during interaction with this nurse. He does report hitting things when he gets angry and agitated. Encouraged pt to work on Conservation officer, historic buildings for anger management. Pt reported that nothing seems to work. Pt still does appear to be paranoid even though he denies it. He asked about who brought his clean clothes back to his room and was informed and when asked if anything was wrong, nervously giggled. He continues to have nicotine cravings for which he was provided his nicorette gum (see MAR). At bedtime, he reported having trouble sleeping due to nicotine cravings and excessive worry. Vistaril 25 mg po PRN for anxiety administered at 2221, which has been effective. Pt has been sleeping. Active listening, reassurance, and support provided. Pt denies SI/HI and AVH. Every 15 minute safety checks continue. Pt's safety has been maintained.   10/31/23 2041  Psych Admission Type (Psych Patients Only)  Admission Status Involuntary  Psychosocial Assessment  Patient Complaints Anxiety;Irritability;Sadness  Eye Contact Fair  Facial Expression Flat;Sad  Affect Sad;Irritable;Flat  Speech Logical/coherent  Interaction Minimal;Guarded;Forwards little  Motor Activity Slow  Appearance/Hygiene Improved  Behavior Characteristics Cooperative;Appropriate to situation;Anxious;Irritable  Mood Anxious;Irritable;Sad  Thought Process  Coherency WDL  Content Blaming others;Paranoia  Delusions Paranoid  Perception WDL  Hallucination None reported or observed  Judgment Poor  Confusion None  Danger to Self  Current suicidal ideation? Denies  Danger to Others  Danger to Others None reported or observed

## 2023-10-31 NOTE — [facility] Group Notes (Signed)
[facility] Group Notes:  (Nursing/MHT/Case Management/Adjunct)  Date:  10/31/2023  Time:  8:30 PM  Type of Therapy:  Psychoeducational Skills  Participation Level:  Active  Participation Quality:  Attentive  Affect:  Appropriate  Cognitive:  Appropriate  Insight:  Appropriate  Engagement in Group:  Developing/Improving  Modes of Intervention:  Education  Summary of Progress/Problems: The patient expressed in group that he had a good day. He rated his day as a 10 out of 10 since he ate more than yesterday. His goal for tomorrow is to attend more groups.   Shawnese Magner S 10/31/2023, 8:30 PM

## 2023-10-31 NOTE — BHH Group Notes (Signed)
Adult Psychoeducational Group Note  Date:  10/31/2023 Time:  4:49 PM  Group Topic/Focus:  Goals Group:   The focus of this group is to help patients establish daily goals to achieve during treatment and discuss how the patient can incorporate goal setting into their daily lives to aide in recovery.  Participation Level:  Active  Participation Quality:  Appropriate  Affect:  Appropriate  Cognitive:  Appropriate  Insight: Appropriate  Engagement in Group:  Engaged  Modes of Intervention:  Discussion  Additional Comments:  Pt attended the goals group and remained appropriate and engaged throughout the duration of the group.   Sheran Lawless 10/31/2023, 4:49 PM

## 2023-10-31 NOTE — Plan of Care (Signed)
  Problem: Coping: Goal: Ability to verbalize frustrations and anger appropriately will improve Outcome: Progressing Goal: Ability to demonstrate self-control will improve Outcome: Progressing   

## 2023-10-31 NOTE — Group Note (Signed)
Recreation Therapy Group Note   Group Topic:Healthy Decision Making  Group Date: 10/31/2023 Start Time: 1040 End Time: 1130 Facilitators: Hughes Wyndham-McCall, LRT,CTRS Location: [address] Dayroom   Group Topic: Decision Making, Problem Solving, Communication  Goal Area(s) Addresses:  Patient will effectively work with peer towards shared goal.  Patient will identify factors that guided their decision making.  Patient will pro-socially communicate ideas during group session.   Intervention: Survival Scenario - pencil, paper  Group Description: Patients were given a scenario that they were going to be stranded on a deserted [place] for several months before being rescued. Writer tasked them with making a list of 15 things they would choose to bring with them for "survival". The list of items was prioritized most important to least. Each patient would come up with their own list, then work together to create a new list of 15 items while in a group of 3-5 peers. LRT discussed each person's list and how it differed from others. The debrief included discussion of priorities, good decisions versus bad decisions, and how it is important to think before acting so we can make the best decision possible. LRT tied the concept of effective communication among group members to patient's support systems outside of the hospital and its benefit post discharge.  Education: Pharmacist, community, Priorities, Support System, Discharge Planning   Education Outcome: Acknowledges education/In group clarification/Needs additional education   Affect/Mood: Appropriate   Participation Level: Engaged   Participation Quality: Independent   Behavior: Appropriate   Speech/Thought Process: Focused   Insight: Good   Judgement: Good   Modes of Intervention: Activity   Patient Response to Interventions:  Engaged   Education Outcome:  In group clarification offered    Clinical Observations/Individualized  Feedback: Pt was bright and focused. Pt was intent on coming up with adequate items for his list. Pt was able to come up with clothes, shoes, socks, guns, food, house, blow up mattress, sleeping bag, phone, water, boat and house. Pt was also active in helping group come up with their list as well.      Plan: Continue to engage patient in RT group sessions 2-3x/week.   Flynn Lininger-McCall, LRT,CTRS 10/31/2023 11:43 AM

## 2023-11-01 MED ORDER — OLANZAPINE 5 MG PO TBDP
5.0000 mg | ORAL_TABLET | Freq: Every day | ORAL | Status: DC
  Filled 2023-11-01 (×2): qty 1

## 2023-11-01 MED ORDER — OLANZAPINE 5 MG PO TBDP
5.0000 mg | ORAL_TABLET | Freq: Two times a day (BID) | ORAL | Status: DC
  Administered 2023-11-01: 5 mg via ORAL
  Filled 2023-11-01 (×6): qty 1
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
## 2023-11-01 NOTE — Plan of Care (Signed)
  Problem: Education: Goal: Knowledge of [employer] [facility] Education information/materials will improve Outcome: Progressing Goal: Emotional status will improve Outcome: Progressing Goal: Mental status will improve Outcome: Progressing   Problem: Activity: Goal: Interest or engagement in activities will improve Outcome: Progressing Goal: Sleeping patterns will improve Outcome: Progressing   

## 2023-11-01 NOTE — B[facility] Group Notes (Signed)
Adult Psychoeducational Group Note  Date:  11/01/2023 Time:  8:40 PM  Group Topic/Focus:  Wrap-Up Group:   The focus of this group is to help patients review their daily goal of treatment and discuss progress on daily workbooks.  Participation Level:  Active  Participation Quality:  Appropriate  Affect:  Appropriate  Cognitive:  Appropriate  Insight: Appropriate  Engagement in Group:  Engaged  Modes of Intervention:  Discussion and Support  Additional Comments:  Pt told that today was a good day on the unit, the highlight of which were good meals in the cafeteria. On the subject of short term goals, Pt mentioned hoping to discharge this coming week. Pt was encouraged by the Writer to speak with his Treatment Team about this. Pt rated his day a 9 out of 10.  Christ Kick 11/01/2023, 8:40 PM

## 2023-11-01 NOTE — Progress Notes (Signed)
   11/01/23 0556  15 Minute Checks  Location Bedroom  Visual Appearance Calm  Behavior Sleeping  Sleep (Behavioral Health Patients Only)  Calculate sleep? (Click Yes once per 24 hr at 0600 safety check) Yes  Documented sleep last 24 hours 8.25

## 2023-11-01 NOTE — Progress Notes (Signed)
   11/01/23 0805  Psych Admission Type (Psych Patients Only)  Admission Status Involuntary  Psychosocial Assessment  Patient Complaints Anxiety  Eye Contact Fair  Facial Expression Flat;Anxious;Sad  Affect Anxious;Sad  Speech Logical/coherent  Interaction Minimal  Motor Activity Slow  Appearance/Hygiene Improved  Behavior Characteristics Cooperative;Appropriate to situation  Mood Anxious;Sad  Thought Process  Coherency WDL  Content WDL  Delusions None reported or observed  Perception WDL  Hallucination None reported or observed  Judgment Poor  Confusion None  Danger to Self  Current suicidal ideation? Denies  Danger to Others  Danger to Others None reported or observed

## 2023-11-02 LAB — VITAMIN B12: Vitamin B-12: 411 pg/mL (ref 180–914)

## 2023-11-02 LAB — FOLATE: Folate: 6.6 ng/mL (ref >5.9)

## 2023-11-02 MED ORDER — OLANZAPINE 10 MG PO TBDP
10.0000 mg | ORAL_TABLET | Freq: Every day | ORAL | Status: DC
  Administered 2023-11-02: 10 mg via ORAL
  Filled 2023-11-02 (×3): qty 1

## 2023-11-02 MED ORDER — VITAMIN D3 25 MCG PO TABS
2000.0000 [IU] | ORAL_TABLET | Freq: Every day | ORAL | Status: DC
  Administered 2023-11-02 – 2023-11-06 (×5): 2000 [IU] via ORAL
  Filled 2023-11-02: qty 14
  Filled 2023-11-02 (×7): qty 2
  Filled 2023-11-02: qty 14

## 2023-11-02 NOTE — Progress Notes (Signed)
   11/02/23 0300  Psych Admission Type (Psych Patients Only)  Admission Status Involuntary  Psychosocial Assessment  Patient Complaints Anxiety  Eye Contact Fair  Facial Expression Flat;Anxious;Sad  Affect Anxious;Sad  Speech Logical/coherent  Interaction Minimal  Motor Activity Slow  Appearance/Hygiene Improved  Behavior Characteristics Cooperative;Appropriate to situation  Mood Anxious;Sad  Thought Process  Coherency WDL  Content WDL  Delusions None reported or observed  Perception WDL  Hallucination None reported or observed  Judgment Poor  Confusion None  Danger to Self  Current suicidal ideation? Denies  Danger to Others  Danger to Others None reported or observed

## 2023-11-02 NOTE — Plan of Care (Signed)
  Problem: Education: Goal: Emotional status will improve Outcome: Progressing Goal: Verbalization of understanding the information provided will improve Outcome: Progressing   Problem: Education: Goal: Verbalization of understanding the information provided will improve Outcome: Progressing

## 2023-11-02 NOTE — Progress Notes (Signed)
[facility] MD Progress Note  11/02/2023 2:25 PM Omar Owen  MRN:  [id number]  Principal Problem: MDD (major depressive disorder), recurrent episode, severe (HCC) Diagnosis: Principal Problem:   MDD (major depressive disorder), recurrent episode, severe (HCC)  Reason for admission:  Omar Owen is a 22 year old African-American male with prior psychiatric history of depression and ADHD who was admitted involuntarily to Adult [facility] from [facility] ED at [facility] for worsening depression with paranoia in the context of not taking his prescribed psychotropic medications. After medical evaluation/stabilization & clearance, he was transferred to the [facility] Adult unit for further psychiatric evaluation & treatments.   Today's assessment notes: Patient was discussed with treatment team this morning.  He had a better night last night, but did require hydroxyzine for anxiety.  He did not require agitation protocol.  Patient continues to display limited insight into paranoia about cameras in the house and how that paranoia led to his hospitalization.  He has been compliant with medications.  He has been participating in groups.  He required no agitation protocol.  He reported that his mood was "good" today.  He denies any new psychiatric or medical complaints.  He reports that his appetite is good.  He reports that focus and concentration are adequate.  He denies issues with energy.  He reports adequate sleep.  He denies any medication side effects.  He denies suicidal ideations, homicidal ideations, auditory hallucinations, visual or hallucinations.  We discussed the plan to change Zyprexa to 10 mg nightly tonight.  5 mg twice daily yesterday caused excessive daytime somnolence.  Total Time spent with patient: 35 minutes  Past Psychiatric History: Previous Psych Diagnoses: MDD, psychoactive substance induced psychosis, and psychosis Prior inpatient treatment: Yes at [facility] in June 2024, at [place] in  September 2024 Current/prior outpatient treatment: Prior rehab hx: Denies Psychotherapy hx: Yes History of suicide: Denies History of homicide or aggression: Yes with mother and brother Psychiatric medication history: Patient has been on trial of Zyprexa, trazodone, hydroxyzine Psychiatric medication compliance history: Noncompliance Neuromodulation history: Denies Current Psychiatrist: Denies Current therapist: Denies  Past Medical History:  Past Medical History:  Diagnosis Date   ADHD    Schizophrenia (HCC)    History reviewed. No pertinent surgical history. Family History: History reviewed. No pertinent family history. Family Psychiatric  History: See H&P Social History:  Social History   Substance and Sexual Activity  Alcohol Use No     Social History   Substance and Sexual Activity  Drug Use Yes   Types: Marijuana    Social History   Socioeconomic History   Marital status: Single    Spouse name: Not on file   Number of children: Not on file   Years of education: Not on file   Highest education level: Not on file  Occupational History   Not on file  Tobacco Use   Smoking status: Former    Current packs/day: 0.50    Types: Cigarettes   Smokeless tobacco: Never  Vaping Use   Vaping status: Some Days  Substance and Sexual Activity   Alcohol use: No   Drug use: Yes    Types: Marijuana   Sexual activity: Not Currently  Other Topics Concern   Not on file  Social History Narrative   Not on file   Social Determinants of Health   Financial Resource Strain: Not on file  Food Insecurity: Food Insecurity Present (10/27/2023)   Hunger Vital Sign    Worried About Running Out of  Food in the Last Year: Sometimes true    Ran Out of Food in the Last Year: Sometimes true  Transportation Needs: Unmet Transportation Needs (10/27/2023)   PRAPARE - Administrator, Civil Service (Medical): Yes    Lack of Transportation (Non-Medical): Yes  Physical  Activity: Not on file  Stress: Not on file  Social Connections: Not on file   Additional Social History:    Sleep: Good  Appetite:  Good  Current Medications: Current Facility-Administered Medications  Medication Dose Route Frequency Provider Last Rate Last Admin   acetaminophen (TYLENOL) tablet 650 mg  650 mg Oral Q6H PRN Maryagnes Amos, FNP   650 mg at 11/01/23 1912   alum & mag hydroxide-simeth (MAALOX/MYLANTA) 200-200-20 MG/5ML suspension 30 mL  30 mL Oral Q4H PRN Starkes-Perry, Juel Burrow, FNP       diphenhydrAMINE (BENADRYL) capsule 50 mg  50 mg Oral TID PRN Maryagnes Amos, FNP   50 mg at 10/30/23 1356   Or   diphenhydrAMINE (BENADRYL) injection 50 mg  50 mg Intramuscular TID PRN Maryagnes Amos, FNP       haloperidol (HALDOL) tablet 5 mg  5 mg Oral TID PRN Maryagnes Amos, FNP   5 mg at 10/30/23 1356   Or   haloperidol lactate (HALDOL) injection 5 mg  5 mg Intramuscular TID PRN Maryagnes Amos, FNP       hydrOXYzine (ATARAX) tablet 25 mg  25 mg Oral TID PRN Cecilie Lowers, FNP   25 mg at 10/31/23 2221   LORazepam (ATIVAN) tablet 2 mg  2 mg Oral TID PRN Maryagnes Amos, FNP   2 mg at 10/30/23 1356   Or   LORazepam (ATIVAN) injection 2 mg  2 mg Intramuscular TID PRN Starkes-Perry, Juel Burrow, FNP       magnesium hydroxide (MILK OF MAGNESIA) suspension 30 mL  30 mL Oral Daily PRN Starkes-Perry, Juel Burrow, FNP       nicotine polacrilex (NICORETTE) gum 2 mg  2 mg Oral PRN Rex Kras, MD   2 mg at 11/02/23 1252   OLANZapine zydis (ZYPREXA) disintegrating tablet 10 mg  10 mg Oral QHS Golda Acre, MD       sertraline (ZOLOFT) tablet 25 mg  25 mg Oral Daily Ntuen, Tina C, FNP   25 mg at 11/02/23 0750   traZODone (DESYREL) tablet 50 mg  50 mg Oral QHS PRN Cecilie Lowers, FNP   50 mg at 11/01/23 2114   vitamin D3 (CHOLECALCIFEROL) tablet 2,000 Units  2,000 Units Oral Daily Golda Acre, MD       Lab Results:  No results found for this or any  previous visit (from the past 48 hour(s)).  Blood Alcohol level:  Lab Results  Component Value Date   ETH <10 10/26/2023   ETH <10 09/15/2023   Metabolic Disorder Labs: Lab Results  Component Value Date   HGBA1C 5.3 10/29/2023   MPG 105.41 10/29/2023   MPG 93.93 06/10/2023   No results found for: "PROLACTIN" Lab Results  Component Value Date   CHOL 140 10/29/2023   TRIG 54 10/29/2023   HDL 46 10/29/2023   CHOLHDL 3.0 10/29/2023   VLDL 11 10/29/2023   LDLCALC 83 10/29/2023   LDLCALC 69 06/10/2023   Physical Findings: AIMS:  , ,  ,  ,    CIWA:    COWS:     Musculoskeletal: Strength & Muscle Tone: within normal limits Gait &  Station: normal Patient leans: N/A  Psychiatric Specialty Exam:  Presentation  General Appearance:  Appropriate for Environment  Eye Contact: Good  Speech: Clear and Coherent  Speech Volume: Normal  Handedness: Right  Mood and Affect  Mood: Anxious  Affect: Restricted  Thought Process  Thought Processes: Linear  Descriptions of Associations:Intact  Orientation:Full (Time, Place and Person)  Thought Content:Paranoid Ideation  History of Schizophrenia/Schizoaffective disorder:No  Duration of Psychotic Symptoms:Less than six months  Hallucinations:Hallucinations: None    Ideas of Reference:None  Suicidal Thoughts:Suicidal Thoughts: No   Homicidal Thoughts:Homicidal Thoughts: No   Sensorium  Memory: Immediate Fair  Judgment: Poor  Insight: Poor  Executive Functions  Concentration: Fair  Attention Span: Good  Recall: Good  Fund of Knowledge: Good  Language: Good  Psychomotor Activity  Psychomotor Activity: Psychomotor Activity: Normal   Assets  Assets: Communication Skills; Financial Resources/Insurance  Sleep  Sleep: Sleep: Good Number of Hours of Sleep: 7.75   Physical Exam: General: Sitting comfortably. NAD. HEENT: Normocephalic, atraumatic, MMM, EMOI Lungs: no increased  work of breathing noted Heart: no cyanosis Abdomen: Non distended Musculoskeletal: FROM. No obvious deformities Skin: Warm, dry, intact. No rashes noted Neuro: No obvious focal deficits.  Gait and station are normal  Review of Systems  Constitutional: Negative.   HENT: Negative.    Eyes: Negative.   Respiratory: Negative.    Cardiovascular: Negative.   Gastrointestinal: Negative.   Genitourinary: Negative.   Skin: Negative.   Neurological: Negative.   Psychiatric/Behavioral:  Positive for delusions.     Blood pressure 105/73, pulse 85, temperature 98.1 F (36.7 C), temperature source Oral, resp. rate 14, height 6' (1.829 m), weight 62.6 kg, SpO2 97%. Body mass index is 18.72 kg/m.  Treatment Plan Summary: Daily contact with patient to assess and evaluate symptoms and progress in treatment and Medication management Physician Treatment Plan for Primary Diagnosis: Assessment: Brief Psychotic Disorder Plan: Medications: Continue sertraline tablet 50 mg p.o. daily for depression Changed olanzapine to 10 mg nightly starting 11/3 Continue trazodone tablet 50 mg p.o. daily nightly as needed for insomnia Continue hydroxyzine 25 mg tab p.o. 3 times daily as needed for anxiety   Agitation protocol: Benadryl capsule 50 mg p.o. or IM 3 times daily as needed agitation   Haldol tablets 5 mg po IM 3 times daily as needed agitation   Lorazepam tablet 2 mg p.o. or IM 3 times daily as needed agitation     Other PRN Medications -Acetaminophen 650 mg every 6 as needed/mild pain -Maalox 30 mL oral every 4 as needed/digestion -Magnesium hydroxide 30 mL daily as needed/mild constipation   -- The risks/benefits/side-effects/alternatives to this medication were discussed in detail with the patient and time was given for questions. The patient consents to medication trial.  -- Metabolic profile and EKG monitoring obtained while on an atypical antipsychotic (BMI: Lipid Panel: HbgA1c: QTc:)  --  Encouraged patient to participate in unit milieu and in scheduled group therapies    Labs reviewed: CMP: Potassium 3.4, replaced with 40 mEq of potassium x 1 only, glucose 110.  Otherwise normal.  CBC: WNL.  Urine toxicology: Positive for marijuana   Labs ordered: BMP in a.m. 10/29/23, potassium level 3.9 WNL, TSH, hemoglobin A1c: 5.3, vitamin D 25-hydroxy.   EKG reviewed: Last EKG on that June 2024: NSR, vent rate 94, QT/QTc 332/415.  New EKG ordered   Safety and Monitoring: Voluntary admission to inpatient psychiatric unit for safety, stabilization and treatment Daily contact with patient to assess and evaluate  symptoms and progress in treatment Patient's case to be discussed in multi-disciplinary team meeting Observation Level : q15 minute checks Vital signs: q12 hours Precautions: suicide, but pt currently verbally contracts for safety on unit    Discharge Planning: Social work and case management to assist with discharge planning and identification of hospital follow-up needs prior to discharge Estimated LOS: 5-7 days Discharge Concerns: Need to establish a safety plan; Medication compliance and effectiveness Discharge Goals: Return home with outpatient referrals for mental health follow-up including medication management/psychotherapy.   Long Term Goal(s): Improvement in symptoms so as ready for discharge   Short Term Goals: Ability to identify changes in lifestyle to reduce recurrence of condition will improve, Ability to verbalize feelings will improve, Ability to disclose and discuss suicidal ideas, Ability to demonstrate self-control will improve, Ability to identify and develop effective coping behaviors will improve, Ability to maintain clinical measurements within normal limits will improve, Compliance with prescribed medications will improve, and Ability to identify triggers associated with substance abuse/mental health issues will improve     I certify that inpatient  services furnished can reasonably be expected to improve the patient's condition.    Golda Acre, MD 11/02/2023, 2:25 PM Patient ID: Amalia Greenhouse, male   DOB: Mar 03, 2001, 22 y.o.   MRN: [id number]

## 2023-11-02 NOTE — Plan of Care (Signed)
°  Problem: Education: °Goal: Emotional status will improve °Outcome: Progressing °Goal: Mental status will improve °Outcome: Progressing °Goal: Verbalization of understanding the information provided will improve °Outcome: Progressing °  °

## 2023-11-02 NOTE — Plan of Care (Signed)
  Problem: Education: Goal: Knowledge of [facility] Education information/materials will improve Outcome: Progressing Goal: Emotional status will improve Outcome: Progressing Goal: Mental status will improve Outcome: Progressing Goal: Verbalization of understanding the information provided will improve Outcome: Progressing   

## 2023-11-02 NOTE — Progress Notes (Signed)
   11/02/23 0819  Psych Admission Type (Psych Patients Only)  Admission Status Involuntary  Psychosocial Assessment  Patient Complaints None  Eye Contact Fair  Facial Expression Animated  Affect Appropriate to circumstance  Speech Logical/coherent  Interaction Assertive  Motor Activity Other (Comment) (WDL)  Appearance/Hygiene Unremarkable  Behavior Characteristics Cooperative;Appropriate to situation  Mood Pleasant  Thought Process  Coherency WDL  Content WDL  Delusions None reported or observed  Perception WDL  Hallucination None reported or observed  Judgment Poor  Confusion None  Danger to Self  Current suicidal ideation? Denies  Danger to Others  Danger to Others None reported or observed

## 2023-11-02 NOTE — [facility] Group Notes (Signed)
[facility] Group Notes:  (Nursing/MHT/Case Management/Adjunct)  Date:  11/02/2023  Time:  10:23 PM  Type of Therapy:  The focus of this group is to help patients establish daily goals to achieve during treatment and discuss how the patient can incorporate goal setting into their daily lives to aide in recovery.  Participation Level:  Active  Participation Quality:  Appropriate, Sharing, and Supportive  Affect:  Appropriate  Cognitive:  Appropriate  Insight:  Appropriate and Good  Engagement in Group:  Engaged and Supportive  Modes of Intervention:  Discussion, Education, Socialization, and Support  Summary of Progress/Problems:  In today's discussion, each person was asked one-on-one, "What is a question for yourself while in this facility?" (What is a recurring query that comes to mind when in this space?) And how are you reasoning about that query?  Pt response " Why Im in here?" Writer then asked pt "what was your first response/thought to that question you ask yourself?" Pt stated " when I get back I'm get right and make sure my environment is changed. They know how to push me and I know their motive into doing that but its okay cause I'm keep having my voice and speak up for myself regardless".  Granville Lewis 11/02/2023, 10:23 PM

## 2023-11-02 NOTE — [facility] Group Notes (Signed)
Type of Therapy and Topic:  Group Therapy: Self Care  Participation Level:  Did Not Attend   Description of Group:   In this group, patients shared and discussed the importance of acknowledging the elements in their lives for which they are taking care of their self mentally and how this can positively impact their mood.  The group discussed how bringing the positive elements of their lives to the forefront of their minds can help with recovery from any illness, physical or mental.  An exercise was done as a group in which a list was made for self care items in order to encourage participants to consider other potential positives in their lives.  Therapeutic Goals: Patients will identify one or more item for which they are taking care of themself mentally  Patients will discuss how it is possible to seek self care in even bad situations. Patients will explore other possible items of self care that they could remember.   Summary of Patient Progress:  NA  Therapeutic Modalities:   Cognitive Behavioral Therapy MI

## 2023-11-03 ENCOUNTER — Encounter (HOSPITAL_COMMUNITY): Payer: Self-pay

## 2023-11-03 DIAGNOSIS — F333 Major depressive disorder, recurrent, severe with psychotic symptoms: Secondary | ICD-10-CM | POA: Diagnosis not present

## 2023-11-03 LAB — RPR: RPR Ser Ql: NONREACTIVE

## 2023-11-03 MED ORDER — OLANZAPINE 15 MG PO TBDP
15.0000 mg | ORAL_TABLET | Freq: Every day | ORAL | Status: DC
  Administered 2023-11-03 – 2023-11-05 (×3): 15 mg via ORAL
  Filled 2023-11-03 (×5): qty 1

## 2023-11-03 NOTE — Progress Notes (Signed)
   11/03/23 0559  15 Minute Checks  Location Bedroom  Visual Appearance Calm  Behavior Composed  Sleep (Behavioral Health Patients Only)  Calculate sleep? (Click Yes once per 24 hr at 0600 safety check) Yes  Documented sleep last 24 hours 6.5

## 2023-11-03 NOTE — Plan of Care (Signed)
?  Problem: Education: Goal: Knowledge of [facility] Education information/materials will improve Outcome: Progressing Goal: Emotional status will improve Outcome: Progressing Goal: Mental status will improve Outcome: Progressing Goal: Verbalization of understanding the information provided will improve Outcome: Progressing   Problem: Activity: Goal: Interest or engagement in activities will improve Outcome: Progressing Goal: Sleeping patterns will improve Outcome: Progressing   Problem: Coping: Goal: Ability to verbalize frustrations and anger appropriately will improve Outcome: Progressing Goal: Ability to demonstrate self-control will improve Outcome: Progressing   Problem: Health Behavior/Discharge Planning: Goal: Identification of resources available to assist in meeting health care needs will improve Outcome: Progressing Goal: Compliance with treatment plan for underlying cause of condition will improve Outcome: Progressing   Problem: Physical Regulation: Goal: Ability to maintain clinical measurements within normal limits will improve Outcome: Progressing   Problem: Safety: Goal: Periods of time without injury will increase Outcome: Progressing   Problem: Activity: Goal: Will verbalize the importance of balancing activity with adequate rest periods Outcome: Progressing   Problem: Education: Goal: Will be free of psychotic symptoms Outcome: Progressing Goal: Knowledge of the prescribed therapeutic regimen will improve Outcome: Progressing   Problem: Coping: Goal: Coping ability will improve Outcome: Progressing Goal: Will verbalize feelings Outcome: Progressing   Problem: Health Behavior/Discharge Planning: Goal: Compliance with prescribed medication regimen will improve Outcome: Progressing   Problem: Nutritional: Goal: Ability to achieve adequate nutritional intake will improve Outcome: Progressing   Problem: Role Relationship: Goal:  Ability to communicate needs accurately will improve Outcome: Progressing Goal: Ability to interact with others will improve Outcome: Progressing   Problem: Safety: Goal: Ability to redirect hostility and anger into socially appropriate behaviors will improve Outcome: Progressing Goal: Ability to remain free from injury will improve Outcome: Progressing   Problem: Self-Care: Goal: Ability to participate in self-care as condition permits will improve Outcome: Progressing   Problem: Self-Concept: Goal: Will verbalize positive feelings about self Outcome: Progressing   Problem: Education: Goal: Utilization of techniques to improve thought processes will improve Outcome: Progressing Goal: Knowledge of the prescribed therapeutic regimen will improve Outcome: Progressing   Problem: Activity: Goal: Interest or engagement in leisure activities will improve Outcome: Progressing Goal: Imbalance in normal sleep/wake cycle will improve Outcome: Progressing   Problem: Coping: Goal: Coping ability will improve Outcome: Progressing Goal: Will verbalize feelings Outcome: Progressing   Problem: Health Behavior/Discharge Planning: Goal: Ability to make decisions will improve Outcome: Progressing Goal: Compliance with therapeutic regimen will improve Outcome: Progressing   Problem: Role Relationship: Goal: Will demonstrate positive changes in social behaviors and relationships Outcome: Progressing   Problem: Safety: Goal: Ability to disclose and discuss suicidal ideas will improve Outcome: Progressing Goal: Ability to identify and utilize support systems that promote safety will improve Outcome: Progressing   Problem: Self-Concept: Goal: Will verbalize positive feelings about self Outcome: Progressing Goal: Level of anxiety will decrease Outcome: Progressing   

## 2023-11-03 NOTE — BH IP Treatment Plan (Signed)
Interdisciplinary Treatment and Diagnostic Plan Update  11/03/2023 Time of Session: 11:55AM - UPDATE Omar Owen MRN: [id number]  Principal Diagnosis: MDD (major depressive disorder), recurrent episode, severe (HCC)  Secondary Diagnoses: Principal Problem:   MDD (major depressive disorder), recurrent episode, severe (HCC)   Current Medications:  Current Facility-Administered Medications  Medication Dose Route Frequency Provider Last Rate Last Admin   acetaminophen (TYLENOL) tablet 650 mg  650 mg Oral Q6H PRN Maryagnes Amos, FNP   650 mg at 11/03/23 1431   alum & mag hydroxide-simeth (MAALOX/MYLANTA) 200-200-20 MG/5ML suspension 30 mL  30 mL Oral Q4H PRN Starkes-Perry, Juel Burrow, FNP       diphenhydrAMINE (BENADRYL) capsule 50 mg  50 mg Oral TID PRN Maryagnes Amos, FNP   50 mg at 10/30/23 1356   Or   diphenhydrAMINE (BENADRYL) injection 50 mg  50 mg Intramuscular TID PRN Maryagnes Amos, FNP       haloperidol (HALDOL) tablet 5 mg  5 mg Oral TID PRN Maryagnes Amos, FNP   5 mg at 10/30/23 1356   Or   haloperidol lactate (HALDOL) injection 5 mg  5 mg Intramuscular TID PRN Maryagnes Amos, FNP       hydrOXYzine (ATARAX) tablet 25 mg  25 mg Oral TID PRN Cecilie Lowers, FNP   25 mg at 10/31/23 2221   LORazepam (ATIVAN) tablet 2 mg  2 mg Oral TID PRN Maryagnes Amos, FNP   2 mg at 10/30/23 1356   Or   LORazepam (ATIVAN) injection 2 mg  2 mg Intramuscular TID PRN Starkes-Perry, Juel Burrow, FNP       magnesium hydroxide (MILK OF MAGNESIA) suspension 30 mL  30 mL Oral Daily PRN Starkes-Perry, Juel Burrow, FNP       nicotine polacrilex (NICORETTE) gum 2 mg  2 mg Oral PRN Rex Kras, MD   2 mg at 11/03/23 1430   OLANZapine zydis (ZYPREXA) disintegrating tablet 15 mg  15 mg Oral QHS Ntuen, Tina C, FNP       sertraline (ZOLOFT) tablet 25 mg  25 mg Oral Daily Ntuen, Tina C, FNP   25 mg at 11/03/23 0837   traZODone (DESYREL) tablet 50 mg  50 mg Oral QHS PRN  Cecilie Lowers, FNP   50 mg at 11/01/23 2114   vitamin D3 (CHOLECALCIFEROL) tablet 2,000 Units  2,000 Units Oral Daily Golda Acre, MD   2,000 Units at 11/03/23 6962   PTA Medications: Medications Prior to Admission  Medication Sig Dispense Refill Last Dose   escitalopram (LEXAPRO) 5 MG tablet Take 5 mg by mouth daily.       Patient Stressors: Marital or family conflict   Medication change or noncompliance    Patient Strengths: Automotive engineer for treatment/growth   Treatment Modalities: Medication Management, Group therapy, Case management,  1 to 1 session with clinician, Psychoeducation, Recreational therapy.   Physician Treatment Plan for Primary Diagnosis: MDD (major depressive disorder), recurrent episode, severe (HCC) Long Term Goal(s): Improvement in symptoms so as ready for discharge   Short Term Goals: Ability to identify changes in lifestyle to reduce recurrence of condition will improve Ability to verbalize feelings will improve Ability to disclose and discuss suicidal ideas Ability to demonstrate self-control will improve Ability to identify and develop effective coping behaviors will improve Ability to maintain clinical measurements within normal limits will improve Compliance with prescribed medications will improve Ability to identify triggers associated with substance abuse/mental health  issues will improve  Medication Management: Evaluate patient's response, side effects, and tolerance of medication regimen.  Therapeutic Interventions: 1 to 1 sessions, Unit Group sessions and Medication administration.  Evaluation of Outcomes: Progressing  Physician Treatment Plan for Secondary Diagnosis: Principal Problem:   MDD (major depressive disorder), recurrent episode, severe (HCC)  Long Term Goal(s): Improvement in symptoms so as ready for discharge   Short Term Goals: Ability to identify changes in lifestyle to reduce recurrence of  condition will improve Ability to verbalize feelings will improve Ability to disclose and discuss suicidal ideas Ability to demonstrate self-control will improve Ability to identify and develop effective coping behaviors will improve Ability to maintain clinical measurements within normal limits will improve Compliance with prescribed medications will improve Ability to identify triggers associated with substance abuse/mental health issues will improve     Medication Management: Evaluate patient's response, side effects, and tolerance of medication regimen.  Therapeutic Interventions: 1 to 1 sessions, Unit Group sessions and Medication administration.  Evaluation of Outcomes: Progressing   RN Treatment Plan for Primary Diagnosis: MDD (major depressive disorder), recurrent episode, severe (HCC) Long Term Goal(s): Knowledge of disease and therapeutic regimen to maintain health will improve  Short Term Goals: Ability to remain free from injury will improve, Ability to verbalize frustration and anger appropriately will improve, Ability to participate in decision making will improve, Ability to verbalize feelings will improve, Ability to identify and develop effective coping behaviors will improve, and Compliance with prescribed medications will improve  Medication Management: RN will administer medications as ordered by provider, will assess and evaluate patient's response and provide education to patient for prescribed medication. RN will report any adverse and/or side effects to prescribing provider.  Therapeutic Interventions: 1 on 1 counseling sessions, Psychoeducation, Medication administration, Evaluate responses to treatment, Monitor vital signs and CBGs as ordered, Perform/monitor CIWA, COWS, AIMS and Fall Risk screenings as ordered, Perform wound care treatments as ordered.  Evaluation of Outcomes: Progressing   LCSW Treatment Plan for Primary Diagnosis: MDD (major depressive  disorder), recurrent episode, severe (HCC) Long Term Goal(s): Safe transition to appropriate next level of care at discharge, Engage patient in therapeutic group addressing interpersonal concerns.  Short Term Goals: Engage patient in aftercare planning with referrals and resources, Increase social support, Increase emotional regulation, Facilitate acceptance of mental health diagnosis and concerns, Identify triggers associated with mental health/substance abuse issues, and Increase skills for wellness and recovery  Therapeutic Interventions: Assess for all discharge needs, 1 to 1 time with Social worker, Explore available resources and support systems, Assess for adequacy in community support network, Educate family and significant other(s) on suicide prevention, Complete Psychosocial Assessment, Interpersonal group therapy.  Evaluation of Outcomes: Progressing   Progress in Treatment: Attending groups: Yes. Participating in groups: Yes. Taking medication as prescribed: Yes. Toleration medication: Yes. Family/Significant other contact made: Yes, contacted: Alinda Money -[phone] Patient understands diagnosis: No. Discussing patient identified problems/goals with staff: Yes. Medical problems stabilized or resolved: Yes. Denies suicidal/homicidal ideation: Yes. Issues/concerns per patient self-inventory: No. Other: N/A   New problem(s) identified: No, Describe:  None reported   New Short Term/Long Term Goal(s):   Patient Goals:  Medication Stabilization   Discharge Plan or Barriers: CSW will continue to follow and assess for appropriate referrals and possible discharge planning.    Reason for Continuation of Hospitalization: Depression Suicidal ideation Withdrawal symptoms   Estimated Length of Stay: 2-3 Days  Last 3 [place] Suicide Severity Risk Score: Flowsheet Row [facility] ([facility]2023 [facility]  Emergency Department at [facility] ED from 09/15/2023 in [facility] Emergency Department at [facility]  C-SSRS RISK CATEGORY No Risk No Risk No Risk       Last [facility] 2/9 Scores:     No data to display          Scribe for Treatment Team: Kathi Der, Theresia Majors 11/03/2023 3:06 PM

## 2023-11-03 NOTE — Group Note (Signed)
Recreation Therapy Group Note   Group Topic:Communication  Group Date: 11/03/2023 Start Time: 1030 End Time: 1050 Facilitators: Kuron Docken-McCall, LRT,CTRS Location: [address] Dayroom   Group Topic: Communication, Team Building, Problem Solving  Goal Area(s) Addresses:  Patient will effectively work with peer towards shared goal.  Patient will identify skills used to make activity successful.  Patient will identify how skills used during activity can be applied to reach post d/c goals.   Intervention: STEM Activity- Glass blower/designer  Group Description: Tallest Pharmacist, community. In teams of 5-6, patients were given 11 craft pipe cleaners. Using the materials provided, patients were instructed to compete again the opposing team(s) to build the tallest free-standing structure from floor level. The activity was timed; difficulty increased by Clinical research associate as Production designer, theatre/television/film continued.  Systematically resources were removed with additional directions for example, placing one arm behind their back, working in silence, and shape stipulations. LRT facilitated post-activity discussion reviewing team processes and necessary communication skills involved in completion. Patients were encouraged to reflect how the skills utilized, or not utilized, in this activity can be incorporated to positively impact support systems post discharge.  Education: Pharmacist, community, Scientist, physiological, Discharge Planning   Education Outcome: Acknowledges education/In group clarification offered/Needs additional education.   Affect/Mood: Appropriate   Participation Level: Engaged   Participation Quality: Independent   Behavior: Appropriate   Speech/Thought Process: Focused   Insight: Moderate   Judgement: Moderate   Modes of Intervention: STEM Activity   Patient Response to Interventions:  Engaged   Education Outcome:  In group clarification offered    Clinical Observations/Individualized Feedback: Pt  was engaged and attentive to peers as they made suggestions. Pt worked well with peers in building their tower. During processing, pt stated they used communication and teamwork to complete activity. Pt took sometime to comprehend how the skills they used in the activity could be used with their support system. Once the concept was broken down, pt was able to understand how interacting with support system can be navigated with the skills used from the activity.     Plan: Continue to engage patient in RT group sessions 2-3x/week.   Hakeen Shipes-McCall, LRT,CTRS 11/03/2023 1:35 PM

## 2023-11-03 NOTE — Progress Notes (Signed)
   11/02/23 2200  Psych Admission Type (Psych Patients Only)  Admission Status Involuntary  Psychosocial Assessment  Patient Complaints None  Eye Contact Brief  Facial Expression Flat  Affect Appropriate to circumstance  Speech Logical/coherent  Interaction Assertive  Motor Activity Other (Comment)  Appearance/Hygiene Unremarkable  Behavior Characteristics Cooperative  Mood Pleasant  Thought Process  Coherency WDL  Content WDL  Delusions None reported or observed  Perception WDL  Hallucination None reported or observed  Judgment Poor  Danger to Self  Current suicidal ideation?  (Denies)  Agreement Not to Harm Self Yes  Description of Agreement Verbal  Danger to Others  Danger to Others None reported or observed   No changes throughout the shift. Very calm and cooperative while awake. Noted with periods of confusion and flighty ideas during his waking hours.

## 2023-11-03 NOTE — Group Note (Signed)
Date:  11/03/2023 Time:  9:07 PM  Group Topic/Focus:  Wrap-Up Group:   The focus of this group is to help patients review their daily goal of treatment and discuss progress on daily workbooks.    Participation Level:  Active  Participation Quality:  Appropriate  Affect:  Appropriate  Cognitive:  Appropriate  Insight: Appropriate  Engagement in Group:  Engaged  Modes of Intervention:  Education and Exploration  Additional Comments:  Patient attended and participated in group tonight. He reports that the most important thing that happened for him today was that he eat more that usual.   Lita Mains [facility] 11/03/2023, 9:07 PM

## 2023-11-03 NOTE — Group Note (Signed)
LCSW Group Therapy Note   Group Date: 11/03/2023 Start Time: 1325 End Time: 1425   Type of Therapy and Topic:  Group Therapy: Goal Planning   Participation Level:  Active     Description of Group: In this process group, patients discussed using strengths to work toward goals and address challenges.  Patients identified two positive things about themselves and one goal they were working on.  Patients were given the opportunity to share openly and support each other's plan for self-empowerment.  The group discussed the value of gratitude and were encouraged to have a daily reflection of positive characteristics or circumstances.  Patients were encouraged to identify a plan to utilize their strengths to work on current challenges and goals.   Therapeutic Goals Patient will verbalize personal strengths/positive qualities and relate how these can assist with achieving desired personal goals Patients will verbalize affirmation of peers plans for personal change and goal setting Patients will explore the value of gratitude and positive focus as related to successful achievement of goals Patients will verbalize a plan for regular reinforcement of personal positive qualities and circumstances.   Summary of Patient Progress: Patient identified the definition of goals. Patients accepted the provided worksheet and followed along by writing out his goals. Patient verbalized personal strength and how they relate to achieving the desired goal such as getting a home to live in and making money. Patient was able to identify positive goals to work towards when he is discharged and remained in group the entire time.       Therapeutic Modalities Cognitive Behavioral Therapy Motivational Interviewing      Omar Owen 11/03/2023  3:35 PM

## 2023-11-03 NOTE — Progress Notes (Signed)
[facility] MD Progress Note  11/03/2023 2:20 PM Omar Owen  MRN:  [id number]  Principal Problem: MDD (major depressive disorder), recurrent episode, severe (HCC) Diagnosis: Principal Problem:   MDD (major depressive disorder), recurrent episode, severe (HCC)  Reason for admission:  Omar Owen is a 22 year old African-American male with prior psychiatric history of depression and ADHD who was admitted involuntarily to [facility] from [facility] health ED at [facility] for worsening depression with paranoia in the context of not taking his prescribed psychotropic medications. After medical evaluation/stabilization & clearance, he was transferred to the [facility] Adult unit for further psychiatric evaluation & treatments.   Today's assessment notes:  Patient was seen and examined on [address].  Mood and affect appears brighter.  However still present paranoia with minimal insight into the reason that he is at the hospital.  He is alert and oriented x 4 and requested to know about his discharge planning.  He has been compliant with medications and participating in groups.  He required no agitation protocol that.  He denies any new psychiatric or medical complaints.  He reports that his appetite is good.  He reports that focus and concentration are adequate.  He denies issues with energy.  He reports adequate sleep.  He denies any medication side effects.  He denies suicidal ideations, homicidal ideations, auditory hallucinations, visual or hallucinations.  We discussed the plan to change Zyprexa to 15 mg nightly tonight patient on board with medication adjustment.  Total Time spent with patient: 35 minutes  Past Psychiatric History: Previous Psych Diagnoses: MDD, psychoactive substance induced psychosis, and psychosis Prior inpatient treatment: Yes at [facility] in June 2024, at [facility] in September 2024 Current/prior outpatient treatment: Prior rehab hx: Denies Psychotherapy hx: Yes History of suicide:  Denies History of homicide or aggression: Yes with mother and brother Psychiatric medication history: Patient has been on trial of Zyprexa, trazodone, hydroxyzine Psychiatric medication compliance history: Noncompliance Neuromodulation history: Denies Current Psychiatrist: Denies Current therapist: Denies  Past Medical History:  Past Medical History:  Diagnosis Date   ADHD    Schizophrenia (HCC)    History reviewed. No pertinent surgical history. Family History: History reviewed. No pertinent family history. Family Psychiatric  History: See H&P Social History:  Social History   Substance and Sexual Activity  Alcohol Use No     Social History   Substance and Sexual Activity  Drug Use Yes   Types: Marijuana    Social History   Socioeconomic History   Marital status: Single    Spouse name: Not on file   Number of children: Not on file   Years of education: Not on file   Highest education level: Not on file  Occupational History   Not on file  Tobacco Use   Smoking status: Former    Current packs/day: 0.50    Types: Cigarettes   Smokeless tobacco: Never  Vaping Use   Vaping status: Some Days  Substance and Sexual Activity   Alcohol use: No   Drug use: Yes    Types: Marijuana   Sexual activity: Not Currently  Other Topics Concern   Not on file  Social History Narrative   Not on file   Social Determinants of Health   Financial Resource Strain: Not on file  Food Insecurity: Food Insecurity Present (10/27/2023)   Hunger Vital Sign    Worried About Running Out of Food in the Last Year: Sometimes true    Ran Out of Food in the Last Year:  Sometimes true  Transportation Needs: Unmet Transportation Needs (10/27/2023)   PRAPARE - Administrator, Civil Service (Medical): Yes    Lack of Transportation (Non-Medical): Yes  Physical Activity: Not on file  Stress: Not on file  Social Connections: Not on file   Additional Social History:    Sleep:  Good  Appetite:  Good  Current Medications: Current Facility-Administered Medications  Medication Dose Route Frequency Provider Last Rate Last Admin   acetaminophen (TYLENOL) tablet 650 mg  650 mg Oral Q6H PRN Maryagnes Amos, FNP   650 mg at 11/01/23 1912   alum & mag hydroxide-simeth (MAALOX/MYLANTA) 200-200-20 MG/5ML suspension 30 mL  30 mL Oral Q4H PRN Starkes-Perry, Juel Burrow, FNP       diphenhydrAMINE (BENADRYL) capsule 50 mg  50 mg Oral TID PRN Maryagnes Amos, FNP   50 mg at 10/30/23 1356   Or   diphenhydrAMINE (BENADRYL) injection 50 mg  50 mg Intramuscular TID PRN Maryagnes Amos, FNP       haloperidol (HALDOL) tablet 5 mg  5 mg Oral TID PRN Maryagnes Amos, FNP   5 mg at 10/30/23 1356   Or   haloperidol lactate (HALDOL) injection 5 mg  5 mg Intramuscular TID PRN Maryagnes Amos, FNP       hydrOXYzine (ATARAX) tablet 25 mg  25 mg Oral TID PRN Cecilie Lowers, FNP   25 mg at 10/31/23 2221   LORazepam (ATIVAN) tablet 2 mg  2 mg Oral TID PRN Maryagnes Amos, FNP   2 mg at 10/30/23 1356   Or   LORazepam (ATIVAN) injection 2 mg  2 mg Intramuscular TID PRN Starkes-Perry, Juel Burrow, FNP       magnesium hydroxide (MILK OF MAGNESIA) suspension 30 mL  30 mL Oral Daily PRN Starkes-Perry, Juel Burrow, FNP       nicotine polacrilex (NICORETTE) gum 2 mg  2 mg Oral PRN Rex Kras, MD   2 mg at 11/03/23 1125   OLANZapine zydis (ZYPREXA) disintegrating tablet 10 mg  10 mg Oral QHS Golda Acre, MD   10 mg at 11/02/23 2224   sertraline (ZOLOFT) tablet 25 mg  25 mg Oral Daily Shaquaya Wuellner, Jesusita Oka, FNP   25 mg at 11/03/23 0837   traZODone (DESYREL) tablet 50 mg  50 mg Oral QHS PRN Cecilie Lowers, FNP   50 mg at 11/01/23 2114   vitamin D3 (CHOLECALCIFEROL) tablet 2,000 Units  2,000 Units Oral Daily Golda Acre, MD   2,000 Units at 11/03/23 4098   Lab Results:  Results for orders placed or performed during the hospital encounter of 10/27/23 (from the past 48  hour(s))  Folate     Status: None   Collection Time: 11/02/23  6:12 PM  Result Value Ref Range   Folate 6.6 >5.9 ng/mL    Comment: Performed at [facility], [address] [place], [place] [address]  Vitamin B12     Status: None   Collection Time: 11/02/23  6:12 PM  Result Value Ref Range   Vitamin B-12 411 180 - 914 pg/mL    Comment: (NOTE) This assay is not validated for testing neonatal or myeloproliferative syndrome specimens for Vitamin B12 levels. Performed at [facility], [address], [place], [place] [address]   RPR     Status: None   Collection Time: 11/02/23  6:12 PM  Result Value Ref Range   RPR Ser Ql NON  REACTIVE NON REACTIVE    Comment: Performed at [facility], [address], [place], [place] [address]   Blood Alcohol level:  Lab Results  Component Value Date   [facility] <10 10/26/2023   ETH <10 09/15/2023   Metabolic Disorder Labs: Lab Results  Component Value Date   HGBA1C 5.3 10/29/2023   MPG 105.41 10/29/2023   MPG 93.93 06/10/2023   No results found for: "PROLACTIN" Lab Results  Component Value Date   CHOL 140 10/29/2023   TRIG 54 10/29/2023   HDL 46 10/29/2023   CHOLHDL 3.0 10/29/2023   VLDL 11 10/29/2023   LDLCALC 83 10/29/2023   LDLCALC 69 06/10/2023   Physical Findings: AIMS:  , ,  ,  ,    CIWA:    COWS:     Musculoskeletal: Strength & Muscle Tone: within normal limits Gait & Station: normal Patient leans: N/A  Psychiatric Specialty Exam:  Presentation  General Appearance:  Appropriate for Environment  Eye Contact: Good  Speech: Clear and Coherent  Speech Volume: Normal  Handedness: Right  Mood and Affect  Mood: Anxious  Affect: Congruent  Thought Process  Thought Processes: Linear  Descriptions of Associations:Intact  Orientation:Full (Time, Place and Person)  Thought Content:Paranoid Ideation  History of Schizophrenia/Schizoaffective  disorder:No  Duration of Psychotic Symptoms:Less than six months  Hallucinations:Hallucinations: None  Ideas of Reference:None  Suicidal Thoughts:Suicidal Thoughts: No  Homicidal Thoughts:Homicidal Thoughts: No  Sensorium  Memory: Immediate Fair  Judgment: Poor  Insight: Poor  Executive Functions  Concentration: Fair  Attention Span: Fair  Recall: [employer] of Knowledge: Fair  Language: Fair  Psychomotor Activity  Psychomotor Activity: Psychomotor Activity: Normal  Assets  Assets: Communication Skills; Desire for Improvement; Physical Health; Resilience  Sleep  Sleep: Sleep: Good Number of Hours of Sleep: 7  Physical Exam: General: Sitting comfortably. NAD. HEENT: Normocephalic, atraumatic, MMM, EMOI Lungs: no increased work of breathing noted Heart: no cyanosis Abdomen: Non distended Musculoskeletal: FROM. No obvious deformities Skin: Warm, dry, intact. No rashes noted Neuro: No obvious focal deficits.  Gait and station are normal  Review of Systems  Constitutional: Negative.   HENT: Negative.    Eyes: Negative.   Respiratory: Negative.    Cardiovascular: Negative.   Gastrointestinal: Negative.   Genitourinary: Negative.   Skin: Negative.   Neurological: Negative.   Psychiatric/Behavioral:  Positive for delusions.     Blood pressure 107/73, pulse 85, temperature (!) 97.5 F (36.4 C), temperature source Oral, resp. rate 16, height 6' (1.829 m), weight 62.6 kg, SpO2 98%. Body mass index is 18.72 kg/m.  Treatment Plan Summary: Daily contact with patient to assess and evaluate symptoms and progress in treatment and Medication management Physician Treatment Plan for Primary Diagnosis: Assessment: Brief Psychotic Disorder Plan: Medications: Continue sertraline tablet 50 mg p.o. daily for depression Changed olanzapine to 15 mg nightly starting 11/4 Continue trazodone tablet 50 mg p.o. daily nightly as needed for insomnia Continue  hydroxyzine 25 mg tab p.o. 3 times daily as needed for anxiety   Agitation protocol: Benadryl capsule 50 mg p.o. or IM 3 times daily as needed agitation   Haldol tablets 5 mg po IM 3 times daily as needed agitation   Lorazepam tablet 2 mg p.o. or IM 3 times daily as needed agitation     Other PRN Medications -Acetaminophen 650 mg every 6 as needed/mild pain -Maalox 30 mL oral every 4 as needed/digestion -Magnesium hydroxide 30 mL daily as needed/mild constipation   -- The  risks/benefits/side-effects/alternatives to this medication were discussed in detail with the patient and time was given for questions. The patient consents to medication trial.  -- Metabolic profile and EKG monitoring obtained while on an atypical antipsychotic (BMI: Lipid Panel: HbgA1c: QTc:)  -- Encouraged patient to participate in unit milieu and in scheduled group therapies    Labs reviewed: CMP: Potassium 3.4, replaced with 40 mEq of potassium x 1 only, glucose 110.  Otherwise normal.  CBC: WNL.  Urine toxicology: Positive for marijuana   Labs ordered: BMP in a.m. 10/29/23, potassium level 3.9 WNL, TSH, hemoglobin A1c: 5.3, vitamin D 25-hydroxy.   EKG reviewed: Last EKG on that June 2024: NSR, vent rate 94, QT/QTc 332/415.  New EKG ordered   Safety and Monitoring: Voluntary admission to inpatient psychiatric unit for safety, stabilization and treatment Daily contact with patient to assess and evaluate symptoms and progress in treatment Patient's case to be discussed in multi-disciplinary team meeting Observation Level : q15 minute checks Vital signs: q12 hours Precautions: suicide, but pt currently verbally contracts for safety on unit    Discharge Planning: Social work and case management to assist with discharge planning and identification of hospital follow-up needs prior to discharge Estimated LOS: 5-7 days Discharge Concerns: Need to establish a safety plan; Medication compliance and  effectiveness Discharge Goals: Return home with outpatient referrals for mental health follow-up including medication management/psychotherapy.   Long Term Goal(s): Improvement in symptoms so as ready for discharge   Short Term Goals: Ability to identify changes in lifestyle to reduce recurrence of condition will improve, Ability to verbalize feelings will improve, Ability to disclose and discuss suicidal ideas, Ability to demonstrate self-control will improve, Ability to identify and develop effective coping behaviors will improve, Ability to maintain clinical measurements within normal limits will improve, Compliance with prescribed medications will improve, and Ability to identify triggers associated with substance abuse/mental health issues will improve     I certify that inpatient services furnished can reasonably be expected to improve the patient's condition.    Cecilie Lowers, FNP 11/03/2023, 2:20 PM Patient ID: Omar Owen, male   DOB: [birth year], 22 y.o.   MRN: [id number] Patient ID: Columbus Ice, male   DOB: 2001-01-06, 22 y.o.   MRN: [id number]

## 2023-11-03 NOTE — Progress Notes (Signed)
   11/03/23 2000  Psych Admission Type (Psych Patients Only)  Admission Status Involuntary  Psychosocial Assessment  Patient Complaints None  Eye Contact Fair  Facial Expression Animated  Affect Appropriate to circumstance  Speech Logical/coherent;Soft  Interaction Assertive  Motor Activity Other (Comment) (WDL)  Appearance/Hygiene Unremarkable  Behavior Characteristics Cooperative  Mood Pleasant  Thought Process  Coherency WDL  Content WDL  Delusions None reported or observed  Perception WDL  Hallucination None reported or observed  Judgment Limited  Confusion None  Danger to Self  Current suicidal ideation? Denies  Agreement Not to Harm Self Yes  Description of Agreement verbal  Danger to Others  Danger to Others None reported or observed

## 2023-11-04 DIAGNOSIS — F333 Major depressive disorder, recurrent, severe with psychotic symptoms: Secondary | ICD-10-CM | POA: Diagnosis not present

## 2023-11-04 MED ORDER — SERTRALINE HCL 50 MG PO TABS
50.0000 mg | ORAL_TABLET | Freq: Every day | ORAL | Status: DC
  Administered 2023-11-05 – 2023-11-06 (×2): 50 mg via ORAL
  Filled 2023-11-04: qty 1
  Filled 2023-11-04 (×3): qty 7
  Filled 2023-11-04 (×2): qty 1

## 2023-11-04 NOTE — Plan of Care (Signed)
  Problem: Education: Goal: Emotional status will improve Outcome: Progressing Goal: Mental status will improve Outcome: Progressing   Problem: Activity: Goal: Interest or engagement in activities will improve Outcome: Progressing   Problem: Coping: Goal: Ability to demonstrate self-control will improve Outcome: Progressing   Problem: Coping: Goal: Coping ability will improve Outcome: Progressing Goal: Will verbalize feelings Outcome: Progressing   Problem: Nutritional: Goal: Ability to achieve adequate nutritional intake will improve Outcome: Progressing   Problem: Role Relationship: Goal: Ability to communicate needs accurately will improve Outcome: Progressing Goal: Ability to interact with others will improve Outcome: Progressing

## 2023-11-04 NOTE — [place] Notes (Signed)
   11/04/23 1000  Psych Admission Type (Psych Patients Only)  Admission Status Involuntary  Psychosocial Assessment  Patient Complaints None  Eye Contact Fair  Facial Expression Animated  Affect Appropriate to circumstance  Speech Logical/coherent  Interaction Assertive  Motor Activity Other (Comment)  Appearance/Hygiene Unremarkable  Behavior Characteristics Cooperative  Mood Pleasant  Thought Process  Coherency WDL  Content WDL  Delusions None reported or observed  Perception WDL  Hallucination None reported or observed  Judgment Poor  Confusion None  Danger to Self  Current suicidal ideation? Denies  Danger to Others  Danger to Others None reported or observed

## 2023-11-04 NOTE — Progress Notes (Signed)
[facility] MD Progress Note  11/04/2023 5:23 PM Omar Owen  MRN:  [id number]  Principal Problem: MDD (major depressive disorder), recurrent episode, severe (HCC) Diagnosis: Principal Problem:   MDD (major depressive disorder), recurrent episode, severe (HCC)  Reason for admission:  Omar Owen is a 22 year old African-American male with prior psychiatric history of depression and ADHD who was admitted involuntarily to [facility] from [facility] health ED at [facility] for worsening depression with paranoia in the context of not taking his prescribed psychotropic medications. After medical evaluation/stabilization & clearance, he was transferred to the [facility] Adult unit for further psychiatric evaluation & treatments.   Today's assessment notes: Patient was seen and examined on [address].  Mood and affect appears brighter with smiles.  However, still present with slight paranoia which is baseline for patient.  Presents with fair insight, reporting that he will no longer break the parents TV and would listen to his parents.  He is alert and oriented x 4 and requested to know about his discharge planning.  He has been compliant with medications and participating in groups.  He required no agitation protocol and denies any new psychiatric or medical complaints.   Reports that appetite is good.   Nursing staff report that patient's focus and concentration is improving.   Omar Owen reports his energy level is adequate.  Reports adequate sleep and sleeping over 8 hours last night and being restful.  Denies any medication side effects.  Denies suicidal ideations, homicidal ideations, auditory hallucinations, visual or hallucinations.   No changes to his treatment plan today, he continues on olanzapine 15 mg p.o. q. nightly for psychosis, Zoloft tablet 50 mg p.o. daily for depression.  Patient appears to be adjusting well with this treatment plan.  Total Time spent with patient: 35 minutes  Past Psychiatric  History: Previous Psych Diagnoses: MDD, psychoactive substance induced psychosis, and psychosis Prior inpatient treatment: Yes at [facility] in June 2024, at [facility] in September 2024 Current/prior outpatient treatment: Prior rehab hx: Denies Psychotherapy hx: Yes History of suicide: Denies History of homicide or aggression: Yes with mother and brother Psychiatric medication history: Patient has been on trial of Zyprexa, trazodone, hydroxyzine Psychiatric medication compliance history: Noncompliance Neuromodulation history: Denies Current Psychiatrist: Denies Current therapist: Denies  Past Medical History:  Past Medical History:  Diagnosis Date   ADHD    Schizophrenia (HCC)    History reviewed. No pertinent surgical history. Family History: History reviewed. No pertinent family history. Family Psychiatric  History: See H&P Social History:  Social History   Substance and Sexual Activity  Alcohol Use No     Social History   Substance and Sexual Activity  Drug Use Yes   Types: Marijuana    Social History   Socioeconomic History   Marital status: Single    Spouse name: Not on file   Number of children: Not on file   Years of education: Not on file   Highest education level: Not on file  Occupational History   Not on file  Tobacco Use   Smoking status: Former    Current packs/day: 0.50    Types: Cigarettes   Smokeless tobacco: Never  Vaping Use   Vaping status: Some Days  Substance and Sexual Activity   Alcohol use: No   Drug use: Yes    Types: Marijuana   Sexual activity: Not Currently  Other Topics Concern   Not on file  Social History Narrative   Not on file   Social Determinants of  Health   Financial Resource Strain: Not on file  Food Insecurity: Food Insecurity Present (10/27/2023)   Hunger Vital Sign    Worried About Running Out of Food in the Last Year: Sometimes true    Ran Out of Food in the Last Year: Sometimes true  Transportation Needs: Unmet  Transportation Needs (10/27/2023)   PRAPARE - Administrator, Civil Service (Medical): Yes    Lack of Transportation (Non-Medical): Yes  Physical Activity: Not on file  Stress: Not on file  Social Connections: Not on file   Additional Social History:    Sleep: Good  Appetite:  Good  Current Medications: Current Facility-Administered Medications  Medication Dose Route Frequency Provider Last Rate Last Admin   acetaminophen (TYLENOL) tablet 650 mg  650 mg Oral Q6H PRN Maryagnes Amos, FNP   650 mg at 11/03/23 1431   alum & mag hydroxide-simeth (MAALOX/MYLANTA) 200-200-20 MG/5ML suspension 30 mL  30 mL Oral Q4H PRN Starkes-Perry, Juel Burrow, FNP       diphenhydrAMINE (BENADRYL) capsule 50 mg  50 mg Oral TID PRN Maryagnes Amos, FNP   50 mg at 10/30/23 1356   Or   diphenhydrAMINE (BENADRYL) injection 50 mg  50 mg Intramuscular TID PRN Maryagnes Amos, FNP       haloperidol (HALDOL) tablet 5 mg  5 mg Oral TID PRN Maryagnes Amos, FNP   5 mg at 10/30/23 1356   Or   haloperidol lactate (HALDOL) injection 5 mg  5 mg Intramuscular TID PRN Maryagnes Amos, FNP       hydrOXYzine (ATARAX) tablet 25 mg  25 mg Oral TID PRN Cecilie Lowers, FNP   25 mg at 10/31/23 2221   LORazepam (ATIVAN) tablet 2 mg  2 mg Oral TID PRN Maryagnes Amos, FNP   2 mg at 10/30/23 1356   Or   LORazepam (ATIVAN) injection 2 mg  2 mg Intramuscular TID PRN Starkes-Perry, Juel Burrow, FNP       magnesium hydroxide (MILK OF MAGNESIA) suspension 30 mL  30 mL Oral Daily PRN Starkes-Perry, Juel Burrow, FNP       nicotine polacrilex (NICORETTE) gum 2 mg  2 mg Oral PRN Rex Kras, MD   2 mg at 11/04/23 1502   OLANZapine zydis (ZYPREXA) disintegrating tablet 15 mg  15 mg Oral QHS Estrella Alcaraz C, FNP   15 mg at 11/03/23 2055   [START ON 11/05/2023] sertraline (ZOLOFT) tablet 50 mg  50 mg Oral Daily Massengill, Nathan, MD       traZODone (DESYREL) tablet 50 mg  50 mg Oral QHS PRN Cecilie Lowers, FNP   50 mg at 11/01/23 2114   vitamin D3 (CHOLECALCIFEROL) tablet 2,000 Units  2,000 Units Oral Daily Golda Acre, MD   2,000 Units at 11/04/23 1610   Lab Results:  Results for orders placed or performed during the hospital encounter of 10/27/23 (from the past 48 hour(s))  Folate     Status: None   Collection Time: 11/02/23  6:12 PM  Result Value Ref Range   Folate 6.6 >5.9 ng/mL    Comment: Performed at [facility], [address], [place], [place] [address]  Vitamin B12     Status: None   Collection Time: 11/02/23  6:12 PM  Result Value Ref Range   Vitamin B-12 411 180 - 914 pg/mL    Comment: (NOTE) This assay is not validated for testing neonatal or myeloproliferative syndrome  specimens for Vitamin B12 levels. Performed at [facility], [address], [place], [place] [address]   RPR     Status: None   Collection Time: 11/02/23  6:12 PM  Result Value Ref Range   RPR Ser Ql NON REACTIVE NON REACTIVE    Comment: Performed at [facility], [address], [place], [place] [address]   Blood Alcohol level:  Lab Results  Component Value Date   [facility] <10 10/26/2023   ETH <10 09/15/2023   Metabolic Disorder Labs: Lab Results  Component Value Date   HGBA1C 5.3 10/29/2023   MPG 105.41 10/29/2023   MPG 93.93 06/10/2023   No results found for: "PROLACTIN" Lab Results  Component Value Date   CHOL 140 10/29/2023   TRIG 54 10/29/2023   HDL 46 10/29/2023   CHOLHDL 3.0 10/29/2023   VLDL 11 10/29/2023   LDLCALC 83 10/29/2023   LDLCALC 69 06/10/2023   Physical Findings: AIMS:  , ,  ,  ,    CIWA:    COWS:     Musculoskeletal: Strength & Muscle Tone: within normal limits Gait & Station: normal Patient leans: N/A  Psychiatric Specialty Exam:  Presentation  General Appearance:  Appropriate for Environment; Casual  Eye Contact: Good  Speech: Clear and Coherent  Speech  Volume: Normal  Handedness: Right  Mood and Affect  Mood: -- (Improving)  Affect: Congruent  Thought Process  Thought Processes: Linear  Descriptions of Associations:Intact  Orientation:Full (Time, Place and Person)  Thought Content:Paranoid Ideation (Improving)  History of Schizophrenia/Schizoaffective disorder:No  Duration of Psychotic Symptoms:Less than six months  Hallucinations:Hallucinations: None  Ideas of Reference:None  Suicidal Thoughts:Suicidal Thoughts: No  Homicidal Thoughts:Homicidal Thoughts: No  Sensorium  Memory: Immediate Fair; Recent Fair  Judgment: Fair  Insight: Present  Executive Functions  Concentration: Good  Attention Span: Good  Recall: [employer] of Knowledge: Fair  Language: Fair  Psychomotor Activity  Psychomotor Activity: Psychomotor Activity: Normal  Assets  Assets: Communication Skills; Desire for Improvement; Physical Health; Resilience  Sleep  Sleep: Sleep: Good Number of Hours of Sleep: 8  Physical Exam: General: Sitting comfortably. NAD. HEENT: Normocephalic, atraumatic, MMM, EMOI Lungs: no increased work of breathing noted Heart: no cyanosis Abdomen: Non distended Musculoskeletal: FROM. No obvious deformities Skin: Warm, dry, intact. No rashes noted Neuro: No obvious focal deficits.  Gait and station are normal  Review of Systems  Constitutional: Negative.   HENT: Negative.    Eyes: Negative.   Respiratory: Negative.    Cardiovascular: Negative.   Gastrointestinal: Negative.   Genitourinary: Negative.   Skin: Negative.   Neurological: Negative.   Psychiatric/Behavioral:  Positive for delusions.    Blood pressure 112/73, pulse 86, temperature (!) 97.4 F (36.3 C), temperature source Oral, resp. rate 16, height 6' (1.829 m), weight 62.6 kg, SpO2 96%. Body mass index is 18.72 kg/m.  Treatment Plan Summary: Daily contact with patient to assess and evaluate symptoms and progress in  treatment and Medication management Physician Treatment Plan for Primary Diagnosis: Assessment: Brief Psychotic Disorder Plan: Medications: Continue sertraline tablet 50 mg p.o. daily for depression Changed olanzapine to 15 mg nightly starting 11/4 Continue trazodone tablet 50 mg p.o. daily nightly as needed for insomnia Continue hydroxyzine 25 mg tab p.o. 3 times daily as needed for anxiety   Agitation protocol: Benadryl capsule 50 mg p.o. or IM 3 times daily as needed agitation   Haldol tablets 5 mg po IM 3 times daily as needed agitation  Lorazepam tablet 2 mg p.o. or IM 3 times daily as needed agitation     Other PRN Medications -Acetaminophen 650 mg every 6 as needed/mild pain -Maalox 30 mL oral every 4 as needed/digestion -Magnesium hydroxide 30 mL daily as needed/mild constipation   -- The risks/benefits/side-effects/alternatives to this medication were discussed in detail with the patient and time was given for questions. The patient consents to medication trial.  -- Metabolic profile and EKG monitoring obtained while on an atypical antipsychotic (BMI: Lipid Panel: HbgA1c: QTc:)  -- Encouraged patient to participate in unit milieu and in scheduled group therapies    Labs reviewed: CMP: Potassium 3.4, replaced with 40 mEq of potassium x 1 only, glucose 110.  Otherwise normal.  CBC: WNL.  Urine toxicology: Positive for marijuana   Labs ordered: BMP in a.m. 10/29/23, potassium level 3.9 WNL, TSH, hemoglobin A1c: 5.3, vitamin D 25-hydroxy.   EKG reviewed: Last EKG on that June 2024: NSR, vent rate 94, QT/QTc 332/415.  New EKG ordered   Safety and Monitoring: Voluntary admission to inpatient psychiatric unit for safety, stabilization and treatment Daily contact with patient to assess and evaluate symptoms and progress in treatment Patient's case to be discussed in multi-disciplinary team meeting Observation Level : q15 minute checks Vital signs: q12 hours Precautions:  suicide, but pt currently verbally contracts for safety on unit    Discharge Planning: Social work and case management to assist with discharge planning and identification of hospital follow-up needs prior to discharge Estimated LOS: 5-7 days Discharge Concerns: Need to establish a safety plan; Medication compliance and effectiveness Discharge Goals: Return home with outpatient referrals for mental health follow-up including medication management/psychotherapy.   Long Term Goal(s): Improvement in symptoms so as ready for discharge   Short Term Goals: Ability to identify changes in lifestyle to reduce recurrence of condition will improve, Ability to verbalize feelings will improve, Ability to disclose and discuss suicidal ideas, Ability to demonstrate self-control will improve, Ability to identify and develop effective coping behaviors will improve, Ability to maintain clinical measurements within normal limits will improve, Compliance with prescribed medications will improve, and Ability to identify triggers associated with substance abuse/mental health issues will improve   I certify that inpatient services furnished can reasonably be expected to improve the patient's condition.    Cecilie Lowers, FNP 11/04/2023, 5:23 PM Patient ID: Omar Owen, male   DOB: 09-02-2001, 22 y.o.   MRN: [id number] Patient ID: Omar Owen, male   DOB: [birth year] 22 y.o.   MRN: [id number] Patient ID: Omar Owen, male   DOB: 17-Apr-2001, 22 y.o.   MRN: [id number]

## 2023-11-04 NOTE — Plan of Care (Signed)
  Problem: Physical Regulation: Goal: Ability to maintain clinical measurements within normal limits will improve Outcome: Progressing   Problem: Safety: Goal: Periods of time without injury will increase Outcome: Progressing   Problem: Activity: Goal: Will verbalize the importance of balancing activity with adequate rest periods Outcome: Progressing   

## 2023-11-04 NOTE — [facility] Group Notes (Signed)
Adult Psychoeducational Group Note  Date:  11/04/2023 Time:  8:27 PM  Group Topic/Focus:  Wrap-Up Group:   The focus of this group is to help patients review their daily goal of treatment and discuss progress on daily workbooks.  Participation Level:  Active  Participation Quality:  Appropriate  Affect:  Appropriate  Cognitive:  Appropriate  Insight: Appropriate  Engagement in Group:  Engaged  Modes of Intervention:  Discussion  Additional Comments:   Pt states he's had a good day and was able to eat good meals and speak with his care team about his d/c plan. Pt states he feels prepared to d/c and is hopeful to remain on his meds and out of trouble. Pt denies everything  Vevelyn Pat 11/04/2023, 8:27 PM

## 2023-11-04 NOTE — Group Note (Signed)
Recreation Therapy Group Note   Group Topic:Leisure Education  Group Date: 11/04/2023 Start Time: 1040 End Time: 1120 Facilitators: Malaijah Houchen-McCall, LRT,CTRS Location: [address] Dayroom   Group Topic: Leisure Education  Goal Area(s) Addresses:  Patient will identify positive leisure activities.  Patient will identify one positive benefit of participation in leisure activities.   Intervention: Leisure Group Game  Group Description: Patient and LRT discussed what leisure interests were and why they are important. LRT explained to patients leisure can be something as simple as a word search or a extravagant as going on a trip to another state/country. LRT gave patients a set of brain teasers, to represent the simplest of leisure activities. Patients were to figure out what each puzzle ment. Patients were given the option to work together or solo on the assignment. Once complete, LRT went over the answers for the brain teasers with patients to see how many they were able to figure out.    Education:  Leisure Exposure, Pharmacist, community, Discharge Planning  Education Outcome: Acknowledges education/In group clarification offered/Needs additional education   Affect/Mood: Appropriate   Participation Level: Active   Participation Quality: Independent   Behavior: Appropriate and Attentive    Speech/Thought Process: Focused   Insight: Moderate   Judgement: Moderate   Modes of Intervention: Worksheet   Patient Response to Interventions:  Attentive   Education Outcome:  In group clarification offered    Clinical Observations/Individualized Feedback: Pt was bright and attentive. Pt struggled to figure out the answers on the worksheets. Pt was mostly quiet during the processing but attentive to the answers. Pt was appropriate during group session.      Plan: Continue to engage patient in RT group sessions 2-3x/week.   Shinita Mac-McCall, LRT,CTRS 11/04/2023 1:36 PM

## 2023-11-04 NOTE — [facility] Group Notes (Signed)
Adult Psychoeducational Group Note  Date:  11/04/2023 Time:  7:44 PM  Group Topic/Focus:  Goals Group:   The focus of this group is to help patients establish daily goals to achieve during treatment and discuss how the patient can incorporate goal setting into their daily lives to aide in recovery. Orientation:   The focus of this group is to educate the patient on the purpose and policies of crisis stabilization and provide a format to answer questions about their admission.  The group details unit policies and expectations of patients while admitted.  Participation Level:  Active  Participation Quality:  Appropriate  Affect:  Appropriate  Cognitive:  Appropriate  Insight: Appropriate  Engagement in Group:  Engaged  Modes of Intervention:  Discussion  Additional Comments:  Pt attended the goals group and remained appropriate and engaged throughout the duration of the group.   Sheran Lawless 11/04/2023, 7:44 PM

## 2023-11-04 NOTE — [facility] Suicide Risk Assessment (Signed)
[facility] INPATIENT:  Family/Significant Other Suicide Prevention Education  Suicide Prevention Education:  Education Completed; Crystal ( mom ) [phone],  (name of family member/significant other) has been identified by the patient as the family member/significant other with whom the patient will be residing, and identified as the person(s) who will aid the patient in the event of a mental health crisis (suicidal ideations/suicide attempt).  With written consent from the patient, the family member/significant other has been provided the following suicide prevention education, prior to the and/or following the discharge of the patient.  Safety planning was completed with mom, Mom shared that she had talked to her son yesterday and that he sounded terrible and told her, "you have me in here selling my blood, they keep taking my blood here " . Mom said that he sounded terrible and seemed angry with he; also that when he is home his anger is out of control . Mom continued on saying that she had not heard anything from the doctor about how he is doing , only spoke with a Child psychotherapist who suggested for her to get guardianship and she did. Mom stated that she needed something from the doctor saying that patient is not competent enough to make his own decisions before taking the petition to court. Mom said that patient can return home but once he is stable because she does not know why he hates them in the house. Mom did say that her husband has a gun and that patient has gotten it and they don't know how so CSW suggested for it to be removed from the home altogether if they lockbox with key or code was not beneficial.    The suicide prevention education provided includes the following: Suicide risk factors Suicide prevention and interventions National Suicide Hotline telephone number [facility] assessment telephone number [facility] Emergency Assistance 911 [facility] and/or Residential  Mobile Crisis Unit telephone number  Request made of family/significant other to: Remove weapons (e.g., guns, rifles, knives), all items previously/currently identified as safety concern.   Remove drugs/medications (over-the-counter, prescriptions, illicit drugs), all items previously/currently identified as a safety concern.  The family member/significant other verbalizes understanding of the suicide prevention education information provided.  The family member/significant other agrees to remove the items of safety concern listed above.  Isabella Bowens 11/04/2023, 3:58 PM

## 2023-11-04 NOTE — Progress Notes (Signed)
    11/04/23 2055  Psych Admission Type (Psych Patients Only)  Admission Status Involuntary  Psychosocial Assessment  Patient Complaints None  Eye Contact Fair  Facial Expression Animated  Affect Appropriate to circumstance  Speech Logical/coherent  Interaction Assertive  Motor Activity Other (Comment) (WDL)  Appearance/Hygiene Unremarkable  Behavior Characteristics Cooperative  Mood Pleasant  Thought Process  Coherency WDL  Content WDL  Delusions None reported or observed  Perception WDL  Hallucination None reported or observed  Judgment Poor  Confusion None  Danger to Self  Current suicidal ideation? Denies  Danger to Others  Danger to Others None reported or observed

## 2023-11-05 DIAGNOSIS — F333 Major depressive disorder, recurrent, severe with psychotic symptoms: Secondary | ICD-10-CM | POA: Diagnosis not present

## 2023-11-05 NOTE — [facility] Group Notes (Signed)
Adult Psychoeducational Group Note  Date:  11/05/2023 Time:  8:27 PM  Group Topic/Focus:  Wrap-Up Group:   The focus of this group is to help patients review their daily goal of treatment and discuss progress on daily workbooks.  Participation Level:  Active  Participation Quality:  Attentive  Affect:  Appropriate  Cognitive:  Alert  Insight: Appropriate  Engagement in Group:  Engaged  Modes of Intervention:  Discussion  Additional Comments:  Patient attended and participated in the Wrap-up group.  Omar Owen 11/05/2023, 8:27 PM

## 2023-11-05 NOTE — Progress Notes (Signed)
   11/05/23 0900  Psych Admission Type (Psych Patients Only)  Admission Status Involuntary  Psychosocial Assessment  Patient Complaints None  Eye Contact Fair  Facial Expression Animated  Affect Appropriate to circumstance  Speech Logical/coherent  Interaction Assertive  Motor Activity Other (Comment)  Appearance/Hygiene Unremarkable  Behavior Characteristics Cooperative  Mood Pleasant  Thought Process  Coherency WDL  Content WDL  Delusions None reported or observed  Perception WDL  Hallucination None reported or observed  Judgment Poor  Confusion None  Danger to Self  Current suicidal ideation? Denies  Danger to Others  Danger to Others None reported or observed

## 2023-11-05 NOTE — Plan of Care (Signed)
  Problem: Education: Goal: Emotional status will improve Outcome: Progressing   Problem: Activity: Goal: Interest or engagement in activities will improve Outcome: Progressing   

## 2023-11-05 NOTE — [facility] Group Notes (Signed)
Adult Psychoeducational Group Note  Date:  11/05/2023 Time:  2:34 PM  Group Topic/Focus:  Dimensions of Wellness:   The focus of this group is to introduce the topic of wellness and discuss the role each dimension of wellness plays in total health.  Participation Level:  Active  Participation Quality:  Attentive  Affect:  Appropriate  Cognitive:  Alert  Insight: Appropriate  Engagement in Group:  Engaged  Modes of Intervention:  Activity  Additional Comments:  Patient attended and participated in the Social Wellness group activity.  Omar Owen 11/05/2023, 2:34 PM

## 2023-11-05 NOTE — Progress Notes (Signed)
Adult Psychoeducational Group Note  Date:  11/05/2023 Time:  11:02 AM  Group Topic/Focus:  Goals Group:   The focus of this group is to help patients establish daily goals to achieve during treatment and discuss how the patient can incorporate goal setting into their daily lives to aide in recovery.  Participation Level:  Active  Participation Quality:  Appropriate  Affect:  Appropriate  Cognitive:  Appropriate  Insight: Appropriate  Engagement in Group:  Engaged  Modes of Intervention:  Discussion  Additional Comments: The patient engagaed in group.  [name] 11/05/2023, 11:02 AM

## 2023-11-05 NOTE — Progress Notes (Signed)
[facility] MD Progress Note  11/05/2023 3:54 PM Rudy Luhmann  MRN:  [id number]  Principal Problem: MDD (major depressive disorder), recurrent episode, severe (HCC) Diagnosis: Principal Problem:   MDD (major depressive disorder), recurrent episode, severe (HCC)  Reason for admission:  Omar Owen is a 22 year old African-American male with prior psychiatric history of depression and ADHD who was admitted involuntarily to [facility] from [facility] health ED at [facility] for worsening depression with paranoia in the context of not taking his prescribed psychotropic medications. After medical evaluation/stabilization & clearance, he was transferred to the [facility] Adult unit for further psychiatric evaluation & treatments.   Today's assessment notes: On assessment today, the pt reports that his mood is euthymic, improved since admission, and stable. Denies feeling down, depressed, or sad. Mood and affect appears brighter with smiles. He is alert and oriented x 4.  He has been compliant with medications and participating in groups. He required no agitation protocol and denies any new psychiatric or medical complaints.  No changes to his treatment plan today, he continues on Olanzapine 15 mg p.o. q. nightly for psychosis, Zoloft tablet 50 mg p.o. daily for depression.  Patient appears to be adjusting well with this treatment plan as his psychotic symptoms are being minimized.  Expected date of discharge to home 11/06/2023.  Patient's mom Nikos Anglemyer at [phone] made aware.  Mom reports she will pick up patient in the morning to home after discharge.  Reports that anxiety symptoms are at manageable level.  Sleep is stable. Appetite is stable.  Concentration is without complaint.  Energy level is adequate. Denies having any suicidal thoughts. Denies having any suicidal intent and plan.  Denies having any HI.  Denies having psychotic symptoms.   Discussed discharge planning: How to identify the signs of  impending crisis, use of internal coping strategies, reaching out to friends and family that can help navigate a crisis, and a list of mental health professionals and agencies to call. Further to follow up on her mental health appointments and her PCP appointments.    Total Time spent with patient: 35 minutes  Past Psychiatric History: Previous Psych Diagnoses: MDD, psychoactive substance induced psychosis, and psychosis Prior inpatient treatment: Yes at [facility] in June 2024, at [place] in September 2024 Current/prior outpatient treatment: Prior rehab hx: Denies Psychotherapy hx: Yes History of suicide: Denies History of homicide or aggression: Yes with mother and brother Psychiatric medication history: Patient has been on trial of Zyprexa, trazodone, hydroxyzine Psychiatric medication compliance history: Noncompliance Neuromodulation history: Denies Current Psychiatrist: Denies Current therapist: Denies  Past Medical History:  Past Medical History:  Diagnosis Date   ADHD    Schizophrenia (HCC)    History reviewed. No pertinent surgical history. Family History: History reviewed. No pertinent family history. Family Psychiatric  History: See H&P Social History:  Social History   Substance and Sexual Activity  Alcohol Use No     Social History   Substance and Sexual Activity  Drug Use Yes   Types: Marijuana    Social History   Socioeconomic History   Marital status: Single    Spouse name: Not on file   Number of children: Not on file   Years of education: Not on file   Highest education level: Not on file  Occupational History   Not on file  Tobacco Use   Smoking status: Former    Current packs/day: 0.50    Types: Cigarettes   Smokeless tobacco: Never  Vaping Use  Vaping status: Some Days  Substance and Sexual Activity   Alcohol use: No   Drug use: Yes    Types: Marijuana   Sexual activity: Not Currently  Other Topics Concern   Not on file  Social History  Narrative   Not on file   Social Determinants of Health   Financial Resource Strain: Not on file  Food Insecurity: Food Insecurity Present (10/27/2023)   Hunger Vital Sign    Worried About Running Out of Food in the Last Year: Sometimes true    Ran Out of Food in the Last Year: Sometimes true  Transportation Needs: Unmet Transportation Needs (10/27/2023)   PRAPARE - Administrator, Civil Service (Medical): Yes    Lack of Transportation (Non-Medical): Yes  Physical Activity: Not on file  Stress: Not on file  Social Connections: Not on file   Additional Social History:    Sleep: Good  Appetite:  Good  Current Medications: Current Facility-Administered Medications  Medication Dose Route Frequency Provider Last Rate Last Admin   acetaminophen (TYLENOL) tablet 650 mg  650 mg Oral Q6H PRN Maryagnes Amos, FNP   650 mg at 11/05/23 1326   alum & mag hydroxide-simeth (MAALOX/MYLANTA) 200-200-20 MG/5ML suspension 30 mL  30 mL Oral Q4H PRN Starkes-Perry, Juel Burrow, FNP       diphenhydrAMINE (BENADRYL) capsule 50 mg  50 mg Oral TID PRN Maryagnes Amos, FNP   50 mg at 10/30/23 1356   Or   diphenhydrAMINE (BENADRYL) injection 50 mg  50 mg Intramuscular TID PRN Maryagnes Amos, FNP       haloperidol (HALDOL) tablet 5 mg  5 mg Oral TID PRN Maryagnes Amos, FNP   5 mg at 10/30/23 1356   Or   haloperidol lactate (HALDOL) injection 5 mg  5 mg Intramuscular TID PRN Maryagnes Amos, FNP       hydrOXYzine (ATARAX) tablet 25 mg  25 mg Oral TID PRN Cecilie Lowers, FNP   25 mg at 10/31/23 2221   LORazepam (ATIVAN) tablet 2 mg  2 mg Oral TID PRN Maryagnes Amos, FNP   2 mg at 10/30/23 1356   Or   LORazepam (ATIVAN) injection 2 mg  2 mg Intramuscular TID PRN Starkes-Perry, Juel Burrow, FNP       magnesium hydroxide (MILK OF MAGNESIA) suspension 30 mL  30 mL Oral Daily PRN Starkes-Perry, Juel Burrow, FNP       nicotine polacrilex (NICORETTE) gum 2 mg  2 mg Oral  PRN Rex Kras, MD   2 mg at 11/05/23 1359   OLANZapine zydis (ZYPREXA) disintegrating tablet 15 mg  15 mg Oral QHS Coreon Simkins C, FNP   15 mg at 11/04/23 2055   sertraline (ZOLOFT) tablet 50 mg  50 mg Oral Daily Massengill, Harrold Donath, MD   50 mg at 11/05/23 0742   traZODone (DESYREL) tablet 50 mg  50 mg Oral QHS PRN Cecilie Lowers, FNP   50 mg at 11/01/23 2114   vitamin D3 (CHOLECALCIFEROL) tablet 2,000 Units  2,000 Units Oral Daily Golda Acre, MD   2,000 Units at 11/05/23 1610   Lab Results:  No results found for this or any previous visit (from the past 48 hour(s)).  Blood Alcohol level:  Lab Results  Component Value Date   [facility] <10 10/26/2023   ETH <10 09/15/2023   Metabolic Disorder Labs: Lab Results  Component Value Date   HGBA1C 5.3 10/29/2023   MPG 105.41  10/29/2023   MPG 93.93 06/10/2023   No results found for: "PROLACTIN" Lab Results  Component Value Date   CHOL 140 10/29/2023   TRIG 54 10/29/2023   HDL 46 10/29/2023   CHOLHDL 3.0 10/29/2023   VLDL 11 10/29/2023   LDLCALC 83 10/29/2023   LDLCALC 69 06/10/2023   Physical Findings: AIMS:  , ,  ,  ,    CIWA:    COWS:     Musculoskeletal: Strength & Muscle Tone: within normal limits Gait & Station: normal Patient leans: N/A  Psychiatric Specialty Exam:  Presentation  General Appearance:  Casual; Fairly Groomed  Eye Contact: Good  Speech: Clear and Coherent  Speech Volume: Normal  Handedness: Right  Mood and Affect  Mood: Euthymic  Affect: Congruent  Thought Process  Thought Processes: Coherent; Linear  Descriptions of Associations:Intact  Orientation:Full (Time, Place and Person)  Thought Content:Paranoid Ideation (Paranoid ideation improving)  History of Schizophrenia/Schizoaffective disorder:No  Duration of Psychotic Symptoms:Less than six months  Hallucinations:Hallucinations: None  Ideas of Reference:None  Suicidal Thoughts:Suicidal Thoughts: No  Homicidal  Thoughts:Homicidal Thoughts: No  Sensorium  Memory: Immediate Fair; Recent Fair  Judgment: Fair  Insight: Fair  Art therapist  Concentration: Fair  Attention Span: Fair  Recall: [employer] of Knowledge: Fair  Language: Fair  Psychomotor Activity  Psychomotor Activity: Psychomotor Activity: Normal  Assets  Assets: Communication Skills; Desire for Improvement; Physical Health; Resilience; Social Support  Sleep  Sleep: Sleep: Good Number of Hours of Sleep: 9  Physical Exam: General: Sitting comfortably. NAD. HEENT: Normocephalic, atraumatic, MMM, EMOI Lungs: no increased work of breathing noted Heart: no cyanosis Abdomen: Non distended Musculoskeletal: FROM. No obvious deformities Skin: Warm, dry, intact. No rashes noted Neuro: No obvious focal deficits.  Gait and station are normal  Review of Systems  Constitutional: Negative.   HENT: Negative.    Eyes: Negative.   Respiratory: Negative.    Cardiovascular: Negative.   Gastrointestinal: Negative.   Genitourinary: Negative.   Skin: Negative.   Neurological: Negative.   Psychiatric/Behavioral:  Positive for delusions.    Blood pressure (!) 117/54, pulse 96, temperature 98.2 F (36.8 C), temperature source Oral, resp. rate 16, height 6' (1.829 m), weight 62.6 kg, SpO2 96%. Body mass index is 18.72 kg/m.  Treatment Plan Summary: Daily contact with patient to assess and evaluate symptoms and progress in treatment and Medication management Physician Treatment Plan for Primary Diagnosis: Assessment: Brief Psychotic Disorder Plan: Medications: Continue sertraline tablet 50 mg p.o. daily for depression Changed olanzapine to 15 mg nightly starting 11/4 Continue trazodone tablet 50 mg p.o. daily nightly as needed for insomnia Continue hydroxyzine 25 mg tab p.o. 3 times daily as needed for anxiety   Agitation protocol: Benadryl capsule 50 mg p.o. or IM 3 times daily as needed agitation   Haldol  tablets 5 mg po IM 3 times daily as needed agitation   Lorazepam tablet 2 mg p.o. or IM 3 times daily as needed agitation     Other PRN Medications -Acetaminophen 650 mg every 6 as needed/mild pain -Maalox 30 mL oral every 4 as needed/digestion -Magnesium hydroxide 30 mL daily as needed/mild constipation   -- The risks/benefits/side-effects/alternatives to this medication were discussed in detail with the patient and time was given for questions. The patient consents to medication trial.  -- Metabolic profile and EKG monitoring obtained while on an atypical antipsychotic (BMI: Lipid Panel: HbgA1c: QTc:)  -- Encouraged patient to participate in unit milieu and in scheduled group therapies  Labs reviewed: CMP: Potassium 3.4, replaced with 40 mEq of potassium x 1 only, glucose 110.  Otherwise normal.  CBC: WNL.  Urine toxicology: Positive for marijuana   Labs ordered: BMP in a.m. 10/29/23, potassium level 3.9 WNL, TSH, hemoglobin A1c: 5.3, vitamin D 25-hydroxy.   EKG reviewed: Last EKG on that June 2024: NSR, vent rate 94, QT/QTc 332/415.  New EKG ordered   Safety and Monitoring: Voluntary admission to inpatient psychiatric unit for safety, stabilization and treatment Daily contact with patient to assess and evaluate symptoms and progress in treatment Patient's case to be discussed in multi-disciplinary team meeting Observation Level : q15 minute checks Vital signs: q12 hours Precautions: suicide, but pt currently verbally contracts for safety on unit    Discharge Planning: Social work and case management to assist with discharge planning and identification of hospital follow-up needs prior to discharge Estimated LOS: 5-7 days Discharge Concerns: Need to establish a safety plan; Medication compliance and effectiveness Discharge Goals: Return home with outpatient referrals for mental health follow-up including medication management/psychotherapy.   Long Term Goal(s): Improvement in  symptoms so as ready for discharge   Short Term Goals: Ability to identify changes in lifestyle to reduce recurrence of condition will improve, Ability to verbalize feelings will improve, Ability to disclose and discuss suicidal ideas, Ability to demonstrate self-control will improve, Ability to identify and develop effective coping behaviors will improve, Ability to maintain clinical measurements within normal limits will improve, Compliance with prescribed medications will improve, and Ability to identify triggers associated with substance abuse/mental health issues will improve   I certify that inpatient services furnished can reasonably be expected to improve the patient's condition.    Cecilie Lowers, FNP 11/05/2023, 3:54 PM Patient ID: Amalia Greenhouse, male   DOB: [birth year], 22 y.o.   MRN: [id number] Patient ID: Karlis Cregg, male   DOB: Jan 25, 2001, 22 y.o.   MRN: [id number] Patient ID: Isais Klipfel, male   DOB: 2001-03-01, 22 y.o.   MRN: [id number] Patient ID: Veryl Winemiller, male   DOB: 2001/03/23, 22 y.o.   MRN: [id number]

## 2023-11-05 NOTE — Group Note (Signed)
Recreation Therapy Group Note   Group Topic:Health and Wellness  Group Date: 11/05/2023 Start Time: 1030 End Time: 1055 Facilitators: Winnona Wargo-McCall, LRT,CTRS Location: [address] Dayroom   Group Topic: Exercise  Goal Area(s) Addresses:  Patient will identify benefits of exercise. Patient will identify a place in the community to go and exercise.  Patient will identify what they like to do for exercise.   Intervention: Dayroom Exercises  Group Description: Group was started with sttretching and discussing exercise, the benefits of exercise and where to exercise. The patients and the LRT did exercises in the dayroom with the group. Patients took turns leading the group in the exercises of their choosing. Patients were encouraged to drink water and take breaks if needed. Patients and LRT debriefed and recapped what they learned about benefits of exercise, and where they can go to exercise.    Education Outcome: Acknowledges education   Affect/Mood: Appropriate   Participation Level: Engaged   Participation Quality: Independent   Behavior: Appropriate   Speech/Thought Process: Focused   Insight: Good   Judgement: Good   Modes of Intervention: Activity   Patient Response to Interventions:  Engaged   Education Outcome:  In group clarification offered    Clinical Observations/Individualized Feedback: Pt was a little late to group but joined right in with the activity. Pt engaged well with peers. Pt led group in exercises such a quad stretches, push ups and 6 inches. Pt was appropriate throughout group.     Plan: Continue to engage patient in RT group sessions 2-3x/week.   Jonie Burdell-McCall, LRT,CTRS 11/05/2023 11:25 AM

## 2023-11-05 NOTE — Progress Notes (Signed)
   11/05/23 0602  15 Minute Checks  Location Bedroom  Visual Appearance Calm  Behavior Sleeping  Sleep (Behavioral Health Patients Only)  Calculate sleep? (Click Yes once per 24 hr at 0600 safety check) Yes  Documented sleep last 24 hours 7.5

## 2023-11-06 DIAGNOSIS — F333 Major depressive disorder, recurrent, severe with psychotic symptoms: Secondary | ICD-10-CM | POA: Diagnosis not present

## 2023-11-06 MED ORDER — VITAMIN D3 25 MCG PO TABS: 2000.0000 [IU] | ORAL_TABLET | Freq: Every day | ORAL | Status: DC

## 2023-11-06 MED ORDER — OLANZAPINE 7.5 MG PO TABS
15.0000 mg | ORAL_TABLET | Freq: Every day | ORAL | Status: DC
  Filled 2023-11-06: qty 14

## 2023-11-06 MED ORDER — OLANZAPINE 15 MG PO TABS
15.0000 mg | ORAL_TABLET | Freq: Every day | ORAL | 0 refills | Status: AC
Start: 2023-11-07 — End: ?

## 2023-11-06 MED ORDER — NICOTINE POLACRILEX 2 MG MT GUM: 2.0000 mg | CHEWING_GUM | OROMUCOSAL | 0 refills | Status: DC | PRN

## 2023-11-06 MED ORDER — HYDROXYZINE HCL 25 MG PO TABS: 25.0000 mg | ORAL_TABLET | Freq: Three times a day (TID) | ORAL | 0 refills | Status: AC | PRN

## 2023-11-06 MED ORDER — SERTRALINE HCL 50 MG PO TABS: 50.0000 mg | ORAL_TABLET | Freq: Every day | ORAL | 0 refills | Status: AC

## 2023-11-06 NOTE — Progress Notes (Signed)

## 2023-11-06 NOTE — [facility] Suicide Risk Assessment (Signed)
Suicide Risk Assessment  Discharge Assessment    [facility] Discharge Suicide Risk Assessment   Principal Problem: MDD (major depressive disorder), recurrent episode, severe (HCC) Discharge Diagnoses: Principal Problem:   MDD (major depressive disorder), recurrent episode, severe (HCC)  Reason for admission:   Omar Owen is a 22 year old African-American male with prior psychiatric history of depression and ADHD who was admitted involuntarily to [facility] from [facility] health ED at [facility] for worsening depression with paranoia in the context of not taking his prescribed psychotropic medications. After medical evaluation/stabilization & clearance, he was transferred to the [facility] Adult unit for further psychiatric evaluation & treatments.   Total Time spent with patient: 30 minutes  Musculoskeletal: Strength & Muscle Tone: within normal limits Gait & Station: normal Patient leans: N/A  Psychiatric Specialty Exam  Presentation  General Appearance:  Casual; Fairly Groomed; Appropriate for Environment  Eye Contact: Good  Speech: Clear and Coherent; Normal Rate  Speech Volume: Normal  Handedness: Right  Mood and Affect  Mood: Euthymic  Duration of Depression Symptoms: Greater than two weeks  Affect: Congruent  Thought Process  Thought Processes: Coherent  Descriptions of Associations:Intact  Orientation:Full (Time, Place and Person)  Thought Content:Logical  History of Schizophrenia/Schizoaffective disorder:No  Duration of Psychotic Symptoms:Less than six months  Hallucinations:Hallucinations: None  Ideas of Reference:None  Suicidal Thoughts:Suicidal Thoughts: No  Homicidal Thoughts:Homicidal Thoughts: No  Sensorium  Memory: Immediate Fair; Remote Fair  Judgment: Fair  Insight: Fair  Executive Functions  Concentration: Good  Attention Span: Good  Recall: Good  Fund of Knowledge: Fair  Language: Good  Psychomotor Activity   Psychomotor Activity: Psychomotor Activity: Normal  Assets  Assets: Communication Skills; Desire for Improvement; Housing; Physical Health; Resilience; Social Support  Sleep  Sleep: Sleep: Good Number of Hours of Sleep: 7  Physical Exam: Physical Exam Vitals and nursing note reviewed.  Constitutional:      Appearance: Normal appearance.  HENT:     Head: Normocephalic.     Mouth/Throat:     Mouth: Mucous membranes are moist.  Eyes:     Extraocular Movements: Extraocular movements intact.  Cardiovascular:     Rate and Rhythm: Normal rate.     Pulses: Normal pulses.  Pulmonary:     Effort: Pulmonary effort is normal.  Abdominal:     Comments: Deferred  Genitourinary:    Comments: Deferred Musculoskeletal:        General: Normal range of motion.     Cervical back: Normal range of motion.  Skin:    General: Skin is warm.  Neurological:     General: No focal deficit present.     Mental Status: He is alert and oriented to person, place, and time.  Psychiatric:        Mood and Affect: Mood normal.        Behavior: Behavior normal.        Thought Content: Thought content normal.    Review of Systems  Constitutional:  Negative for chills and fever.  HENT:  Negative for sore throat.   Eyes:  Negative for blurred vision.  Respiratory:  Negative for cough, sputum production, shortness of breath and wheezing.   Cardiovascular:  Negative for chest pain and palpitations.  Gastrointestinal:  Negative for abdominal pain, constipation, diarrhea, heartburn, nausea and vomiting.  Genitourinary:  Negative for dysuria, frequency and urgency.  Musculoskeletal:  Negative for back pain, myalgias and neck pain.  Skin:  Negative for itching and rash.  Neurological:  Negative for  dizziness, tingling, tremors, sensory change, speech change, seizures and headaches.  Endo/Heme/Allergies:        See allergy listing  Psychiatric/Behavioral:  Negative for hallucinations, substance abuse and  suicidal ideas. The patient is nervous/anxious. The patient does not have insomnia.    Blood pressure 107/63, pulse 99, temperature 97.8 F (36.6 C), temperature source Oral, resp. rate 16, height 6' (1.829 m), weight 62.6 kg, SpO2 97%. Body mass index is 18.72 kg/m.  Mental Status Per Nursing Assessment::   On Admission:  NA  Demographic Factors:  Male, Adolescent or young adult, Low socioeconomic status, and Unemployed  Loss Factors: Financial problems/change in socioeconomic status  Historical Factors: Impulsivity  Risk Reduction Factors:   Living with another person, especially a relative, Positive social support, Positive therapeutic relationship, and Positive coping skills or problem solving skills  Continued Clinical Symptoms:  Alcohol/Substance Abuse/Dependencies Unstable or Poor Therapeutic Relationship Previous Psychiatric Diagnoses and Treatments  Cognitive Features That Contribute To Risk:  Polarized thinking    Suicide Risk:  Mild:  There are no identifiable plans, no associated intent, mild dysphoria and related symptoms, good self-control (both objective and subjective assessment), few other risk factors, and identifiable protective factors, including available and accessible social support.   Follow-up Information     Services, [facility]. Go on 11/07/2023.   Why: You  have a hospital follow up appointment for therapy and medication management services on  11/07/23 at 9:00 am.  This appointment will be held in person. Contact information: [address] [place] [address] [phone]                Plan Of Care/Follow-up recommendations:  Discharge Recommendations:  The patient is being discharged to home. Patient is to take his discharge medications as ordered.  See follow up above. We recommend that he participates in individual therapy to target uncontrollable agitation and substance abuse.  We recommend that he participates in  therapy to target personal conflict, to improve communication skills and conflict resolution skills. Patient is to initiate/implement a contingency based behavioral model to address his behavior. We recommend that he gets AIMS scale, height, weight, blood pressure, fasting lipid panel, fasting blood sugar in three months from discharge if he's on atypical antipsychotics.  Patient will benefit from monitoring of recurrent suicidal ideation since patient is on antidepressant medication. The patient should abstain from all illicit substances and alcohol. If the patient's symptoms worsen or do not continue to improve or if the patient becomes actively suicidal or homicidal then it is recommended that the patient return to the closest hospital emergency room or call 911 for further evaluation and treatment. National Suicide Prevention Lifeline 1800-SUICIDE or [phone] [phone]. Please follow up with your primary medical doctor for all other medical needs.  The patient has been educated on the possible side effects to medications and she/her guardian is to contact a medical professional and inform outpatient provider of any new side effects of medication. He is to take regular diet and activity as tolerated.  Will benefit from moderate daily exercise. Patient and Family was educated about removing/locking any firearms, medications or dangerous products from the home.  Activity:  As tolerated Diet:  Regular Diet  Cecilie Lowers, FNP 11/06/2023, 8:56 AM

## 2023-11-06 NOTE — Progress Notes (Signed)
D: Pt A & O X 3. Denies SI, HI, AVH and pain at this time. D/C home as ordered. Picked up in lobby by his mother.   A: D/C instructions reviewed with pt including prescriptions, medication samples and follow up appointment, compliance encouraged. All belongings from assigned locker returned to pt at time of departure. Scheduled medications given with verbal education and effects monitored. Safety checks maintained without incident till time of d/c.  R: Pt receptive to care. Compliant with medications when offered. Denies adverse drug reactions when assessed. Verbalized understanding related to d/c instructions. Signed belonging sheet in agreement with items received from locker. Ambulatory with a steady gait. Appears to be in no physical distress at time of departure.

## 2023-11-06 NOTE — Discharge Instructions (Signed)
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
## 2023-11-06 NOTE — Progress Notes (Signed)
  [facility] Adult Case Management Discharge Plan :  Will you be returning to the same living situation after discharge:  Yes,  pt will be returning home with mother,  Cathan Gearin [phone] At discharge, do you have transportation home?: Yes,  pt will be transported by mother Do you have the ability to pay for your medications: Yes,  pt has active medical coverage  Release of information consent forms completed and in the chart;  Patient's signature needed at discharge.  Patient to Follow up at:  Follow-up Information     [facility], [facility]. Go on 11/07/2023.   Why: You  have a hospital follow up appointment for therapy and medication management services on  11/07/23 at 9:00 am.  This appointment will be held in person. Contact information: [address] [place] [address] [phone]                 Next level of care provider has access to [facility] Link:no  Safety Planning and Suicide Prevention discussed: Yes,  SPE discussed and pamphlet will be given at the time of discharge.     Has patient been referred to the Quitline?: Patient refused referral for treatment  Patient has been referred for addiction treatment: Patient refused referral for treatment.  Slayde Brault, Candace Cruise, LCSWA 11/06/2023, 9:06 AM

## 2024-05-25 ENCOUNTER — Emergency Department (HOSPITAL_COMMUNITY)
Admission: EM | Admit: 2024-05-25 | Discharge: 2024-05-26 | Disposition: A | Discharge status: Other Acute Inpatient Hospital

## 2024-05-25 ENCOUNTER — Encounter (HOSPITAL_COMMUNITY): Payer: Self-pay

## 2024-05-25 ENCOUNTER — Other Ambulatory Visit: Payer: Self-pay

## 2024-05-25 DIAGNOSIS — F209 Schizophrenia, unspecified: Secondary | ICD-10-CM | POA: Diagnosis not present

## 2024-05-25 DIAGNOSIS — R441 Visual hallucinations: Secondary | ICD-10-CM | POA: Diagnosis present

## 2024-05-25 DIAGNOSIS — F22 Delusional disorders: Secondary | ICD-10-CM | POA: Diagnosis not present

## 2024-05-25 LAB — CBC WITH DIFFERENTIAL/PLATELET
Abs Immature Granulocytes: 0.03 10*3/uL (ref 0.00–0.07)
Basophils Absolute: 0.1 10*3/uL (ref 0.0–0.1)
Basophils Relative: 1 %
Eosinophils Absolute: 0.1 10*3/uL (ref 0.0–0.5)
Eosinophils Relative: 1 %
HCT: 45.8 % (ref 39.0–52.0)
Hemoglobin: 16 g/dL (ref 13.0–17.0)
Immature Granulocytes: 0 %
Lymphocytes Relative: 19 %
Lymphs Abs: 1.9 10*3/uL (ref 0.7–4.0)
MCH: 31.4 pg (ref 26.0–34.0)
MCHC: 34.9 g/dL (ref 30.0–36.0)
MCV: 90 fL (ref 80.0–100.0)
Monocytes Absolute: 0.8 10*3/uL (ref 0.1–1.0)
Monocytes Relative: 9 %
Neutro Abs: 7 10*3/uL (ref 1.7–7.7)
Neutrophils Relative %: 70 %
Platelets: 320 10*3/uL (ref 150–400)
RBC: 5.09 MIL/uL (ref 4.22–5.81)
RDW: 12.3 % (ref 11.5–15.5)
WBC: 9.8 10*3/uL (ref 4.0–10.5)
nRBC: 0 % (ref 0.0–0.2)

## 2024-05-25 LAB — COMPREHENSIVE METABOLIC PANEL WITH GFR
ALT: 13 U/L (ref 0–44)
AST: 20 U/L (ref 15–41)
Albumin: 4.8 g/dL (ref 3.5–5.0)
Alkaline Phosphatase: 58 U/L (ref 38–126)
Anion gap: 14 (ref 5–15)
BUN: 10 mg/dL (ref 6–20)
CO2: 22 mmol/L (ref 22–32)
Calcium: 9.6 mg/dL (ref 8.9–10.3)
Chloride: 102 mmol/L (ref 98–111)
Creatinine, Ser: 0.87 mg/dL (ref 0.61–1.24)
GFR, Estimated: >60 mL/min (ref >60)
Glucose, Bld: 96 mg/dL (ref 70–99)
Potassium: 3.6 mmol/L (ref 3.5–5.1)
Sodium: 138 mmol/L (ref 135–145)
Total Bilirubin: 2 mg/dL — ABNORMAL HIGH (ref 0.0–1.2)
Total Protein: 7.8 g/dL (ref 6.5–8.1)

## 2024-05-25 LAB — SALICYLATE LEVEL: Salicylate Lvl: <7 mg/dL — ABNORMAL LOW (ref 7.0–30.0)

## 2024-05-25 LAB — ACETAMINOPHEN LEVEL: Acetaminophen (Tylenol), Serum: <10 ug/mL — ABNORMAL LOW (ref 10–30)

## 2024-05-25 LAB — ETHANOL: Alcohol, Ethyl (B): <15 mg/dL (ref <15)

## 2024-05-25 MED ORDER — LORAZEPAM 2 MG/ML IJ SOLN: 2.0000 mg | Freq: Once | INTRAMUSCULAR | Status: DC

## 2024-05-25 MED ORDER — ZIPRASIDONE MESYLATE 20 MG IM SOLR
20.0000 mg | Freq: Once | INTRAMUSCULAR | Status: AC
  Administered 2024-05-25: 20 mg via INTRAMUSCULAR
  Filled 2024-05-25: qty 20

## 2024-05-25 MED ORDER — STERILE WATER FOR INJECTION IJ SOLN
INTRAMUSCULAR | Status: AC
  Administered 2024-05-25: 2.1 mL
  Filled 2024-05-25: qty 10

## 2024-05-25 NOTE — ED Notes (Addendum)
 Pts mother Stillman Buenger states she can be reached at any time of the night @ [phone]. Would like any and all updates.

## 2024-05-25 NOTE — ED Notes (Signed)
 Pt belongings secured in Pt locker. Pt changed into maroon hospital scrubs. Pt wanded by security at this time. Pt calm, resting on bed by nurses station.

## 2024-05-25 NOTE — ED Notes (Signed)
 Patient is aggressive to staff. Cursing staff and walking back and forth to both bathrooms.  Allied in hallway and assisted him back to his bed in the hallway. Allied is at bedside. Patient has on pajama pants under his scrubs and refusing to take them off. Security and Nurse aware.

## 2024-05-25 NOTE — ED Notes (Addendum)
 Faxed IVC paperwork to [facility] [phone], [phone] and [phone].

## 2024-05-25 NOTE — ED Notes (Signed)
 Patient has been wanded by security.

## 2024-05-25 NOTE — ED Notes (Signed)
 Called Security to wand patient. He stated he will be here shortly to wand him.

## 2024-05-25 NOTE — ED Notes (Signed)
 Pt getting agitated and refusing blood draw and refusing to provide urine specimen. Pt verbally getting aggressive with ED Staff. EDP Notified.

## 2024-05-25 NOTE — ED Triage Notes (Signed)
 Pt to er, pt is IVC for paranoia and hallucinations.  Pt's mom called Whitney Bingaman, [phone] states that a couple of years ago pt started having visual and auditory hallucinations, states that he is currently having them so she took IVC paper work out on him

## 2024-05-25 NOTE — ED Notes (Signed)
 Pt refusing to allow ED Staff to obtain EKG at this time. Security and [facility] at bedside.

## 2024-05-25 NOTE — ED Provider Notes (Signed)
 [facility] EMERGENCY DEPARTMENT AT [facility] Provider Note   CSN: [id number] Arrival date & time: 05/25/24  1049     History  Chief Complaint  Patient presents with   Psychiatric Evaluation    Omar Owen is a 23 y.o. male.  23 year old man with past medical history of schizophrenia and ADHD presenting to the emergency department today with concern for hallucinations and inability to care for himself at home.  The patient's mother put him on an IVC.  He was sent to the ER for further evaluation.  She reports that over the past few days that he has been talking to people who are not there.  She states that he has been covering up the TVs in their house and thinks that he is being listened to.  One of his friends apparently told her that he claimed that he was talking to satanic spirits.  He does have a diagnosis of schizophrenia.  His mother reports that he has not been bathing or taking care of himself at home over the past few days.  She states that last night that he got home around 1 AM and stood in the driveway until 5:30 AM and would not come in the house.        Home Medications Prior to Admission medications   Medication Sig Start Date End Date Taking? Authorizing Provider  hydrOXYzine  (ATARAX ) 25 MG tablet Take 1 tablet (25 mg total) by mouth 3 (three) times daily as needed for anxiety. 11/06/23   Massengill, Elana Grayer, MD  nicotine  polacrilex (NICORETTE ) 2 MG gum Take 1 each (2 mg total) by mouth as needed for smoking cessation. 11/06/23   Massengill, Elana Grayer, MD  OLANZapine  (ZYPREXA ) 15 MG tablet Take 1 tablet (15 mg total) by mouth at bedtime. 11/07/23   Massengill, Elana Grayer, MD  sertraline  (ZOLOFT ) 50 MG tablet Take 1 tablet (50 mg total) by mouth daily. 11/07/23 12/07/23  Massengill, Elana Grayer, MD  vitamin D3 (CHOLECALCIFEROL ) 25 MCG tablet Take 2 tablets (2,000 Units total) by mouth daily. 11/07/23   Eleanore Grey, MD      Allergies    Patient has no known  allergies.    Review of Systems   Review of Systems  Reason unable to perform ROS: Patient denies all review of systems.  All other systems reviewed and are negative.   Physical Exam Updated Vital Signs BP 120/78 (BP Location: Right Arm)   Pulse 65   Resp 20   Ht 6\' 1"  (1.854 m)   Wt 63.5 kg   SpO2 100%   BMI 18.47 kg/m  Physical Exam Vitals and nursing note reviewed.   Gen: NAD Eyes: PERRL, EOMI HEENT: no oropharyngeal swelling Neck: trachea midline Resp: clear to auscultation bilaterally Card: RRR, no murmurs, rubs, or gallops Abd: nontender, nondistended Extremities: no calf tenderness, no edema Vascular: 2+ radial pulses bilaterally, 2+ DP pulses bilaterally Skin: no rashes Psyc: Flat affect, guarded, does not appear to be reacting to any internal stimuli currently   ED Results / Procedures / Treatments   Labs (all labs ordered are listed, but only abnormal results are displayed) Labs Reviewed  COMPREHENSIVE METABOLIC PANEL WITH GFR - Abnormal; Notable for the following components:      Result Value   Total Bilirubin 2.0 (*)    All other components within normal limits  SALICYLATE LEVEL - Abnormal; Notable for the following components:   Salicylate Lvl <7.0 (*)    All other components within normal limits  ACETAMINOPHEN   LEVEL - Abnormal; Notable for the following components:   Acetaminophen  (Tylenol ), Serum <10 (*)    All other components within normal limits  ETHANOL  CBC WITH DIFFERENTIAL/PLATELET  RAPID URINE DRUG SCREEN, HOSP PERFORMED    EKG None  Radiology No results found.  Procedures Procedures    Medications Ordered in ED Medications  LORazepam  (ATIVAN ) injection 2 mg (has no administration in time range)  ziprasidone (GEODON) injection 20 mg (20 mg Intramuscular Given 05/25/24 1418)  sterile water (preservative free) injection (2.1 mLs  Given 05/25/24 1418)    ED Course/ Medical Decision Making/ A&P                                  Medical Decision Making 23 year old male with past medical history of schizophrenia and ADHD presenting to the emergency department today with what sounds like hallucinations and inability to care for himself.  He is already on an IVC from his family.  Will further evaluate the patient here with labs and have him evaluated by our psychiatric team.  He will likely require hospitalization.  The patient does start get agitated and was refusing any workup.  He is given Geodon.  The patient remained agitated and was given IM Ativan  with improvement.  Labs are back and are unremarkable.  He is medically cleared for psychiatric evaluation.  CRITICAL CARE Performed by: Carin Charleston   Total critical care time: 35 minutes  Critical care time was exclusive of separately billable procedures and treating other patients.  Critical care was necessary to treat or prevent imminent or life-threatening deterioration.  Critical care was time spent personally by me on the following activities: development of treatment plan with patient and/or surrogate as well as nursing, discussions with consultants, evaluation of patient's response to treatment, examination of patient, obtaining history from patient or surrogate, ordering and performing treatments and interventions, ordering and review of laboratory studies, ordering and review of radiographic studies, pulse oximetry and re-evaluation of patient's condition.   Amount and/or Complexity of Data Reviewed Labs: ordered.  Risk Prescription drug management.           Final Clinical Impression(s) / ED Diagnoses Final diagnoses:  Paranoia [facility])    Rx / DC Orders ED Discharge Orders     None         Carin Charleston, MD 05/25/24 2050

## 2024-05-26 NOTE — Progress Notes (Signed)
 Pt has been accepted to [facility]  on 05/26/2024 Bed assignment: Main campus  Pt meets inpatient criteria per Bonnita Buttner, NP   Attending Physician will be Lavona Pounds, MD  Report can be called to: [phone]   Pt can arrive after 8 AM  Care Team Notified:  Alane Allen, RN

## 2024-05-26 NOTE — Discharge Instructions (Signed)
 Transport to [facility]
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
## 2024-05-26 NOTE — Progress Notes (Signed)
 LCSW Progress Note  [id number]   Omar Owen  05/26/2024  2:48 PM  Description:   Inpatient Psychiatric Referral  Patient was recommended inpatient per Bonnita Buttner NP. There are no available beds at [facility], per [facility] Bevin Bucks RN. Patient was referred to the following out of network facilities:   Destination  Service Provider Address Phone Fax  [facility] [address] [place] [address] [phone]  [facility] [address] [place], [place] [address] [phone]  [facility] [address] [place] [address] [phone]  [facility] [address], [place] [address] [phone]  [facility] [address] [place] [address] [phone]  [facility] [address], [place] [address] [phone]  [facility] EFAX [address] [place], [place] [phone]  [facility] [address] [place], [place] [address] [phone]  [facility] [address] [place] [address] [phone]  [facility] [address], [place] [address] [phone]      Situation ongoing, CSW to continue following and update chart as more information becomes available.      [place] Larz Mark MSW, LCSW  05/26/2024 2:48 PM

## 2024-05-26 NOTE — BH Assessment (Signed)
 Clinician messaged Maci N. Marleen Silvius, RN: "Hey. It's Trey with TTS. Is the pt able to engage in the assessment, if so is the pt under IVC? Also is the pt medically cleared?"    Clinician awaiting response.    Rosi Converse, MS, [facility], [facility] Triage Specialist [phone]

## 2024-05-26 NOTE — ED Notes (Signed)
 LEO here for transportation.

## 2024-05-26 NOTE — ED Notes (Signed)
 Escorted pt to bathroom. Pt told me not to follow him. I advised it is hospital policy. Pt stated that I am a pedophile and to stop following him. Escorted pt back to RM 16.

## 2024-05-26 NOTE — ED Provider Notes (Signed)
 Emergency Medicine Observation Re-evaluation Note  Tavius Turgeon is a 23 y.o. male, seen on rounds today.  Pt initially presented to the ED for complaints of Psychiatric Evaluation Currently, the patient is sitting in bed.  Physical Exam  BP 111/65 (BP Location: Left Arm)   Pulse (!) 54   Temp 98.2 F (36.8 C) (Oral)   Resp 14   Ht 6\' 1"  (1.854 m)   Wt 63.5 kg   SpO2 100%   BMI 18.47 kg/m  Physical Exam General: NAD Cardiac: regular rate Lungs: equal chest rise Psych: calm   ED Course / MDM  EKG:   I have reviewed the labs performed to date as well as medications administered while in observation.  Recent changes in the last 24 hours include none.  Plan  Current plan is for psychiatric placement.    Mordecai Applebaum, MD 05/26/24 1030

## 2024-08-12 ENCOUNTER — Other Ambulatory Visit: Payer: Self-pay

## 2024-08-12 ENCOUNTER — Ambulatory Visit
Admission: RE | Admit: 2024-08-12 | Discharge: 2024-08-12 | Disposition: A | Discharge status: Home / Self Care | Source: Ambulatory Visit | Attending: Family Medicine | Admitting: Family Medicine

## 2024-08-12 VITALS — BP 128/81 | HR 88 | Temp 98.5°F | Resp 20

## 2024-08-12 DIAGNOSIS — Z113 Encounter for screening for infections with a predominantly sexual mode of transmission: Secondary | ICD-10-CM | POA: Diagnosis present

## 2024-08-12 NOTE — ED Triage Notes (Signed)
 Pt reports would like to be tested for STD. Pt reports partner tested positive for chlamydia. Pt denies symptoms or pain at this time. Last unprotected intercourse approximately 1 month ago.

## 2024-08-12 NOTE — Discharge Instructions (Signed)
 We will let you know if any of your results from your swab come back abnormal and discuss treatment options.  We do recommend abstinence until these results are available to avoid any new exposures

## 2024-08-12 NOTE — ED Provider Notes (Signed)
 [facility]    CSN: [id number] Arrival date & time: 08/12/24  1049      History   Chief Complaint Chief Complaint  Patient presents with   SEXUALLY TRANSMITTED DISEASE    I just want to test for StI because my gf tested positive for chaylimida - Entered by patient    HPI Omar Owen is a 23 y.o. male.   Patient presenting today requesting STD screening as he has a recent exposure to chlamydia.  Denies any dysuria, hematuria, penile discharge, rashes, lesions, pelvic or abdominal pain.    Past Medical History:  Diagnosis Date   ADHD    Schizophrenia 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 No pertinent surgical history.     Home Medications    Prior to Admission medications   Medication Sig Start Date End Date Taking? Authorizing Provider  hydrOXYzine (ATARAX) 25 MG tablet Take 1 tablet (25 mg total) by mouth 3 (three) times daily as needed for anxiety. Patient not taking: Reported on 05/25/2024 11/06/23   Johny Lot, MD  nicotine polacrilex (NICORETTE) 2 MG gum Take 1 each (2 mg total) by mouth as needed for smoking cessation. Patient not taking: Reported on 05/25/2024 11/06/23   Johny Lot, MD  OLANZapine (ZYPREXA) 15 MG tablet Take 1 tablet (15 mg total) by mouth at bedtime. Patient not taking: Reported on 05/25/2024 11/07/23   Johny Lot, MD  sertraline (ZOLOFT) 50 MG tablet Take 1 tablet (50 mg total) by mouth daily. Patient not taking: Reported on 05/25/2024 11/07/23 12/07/23  Johny Lot, MD  vitamin D3 (CHOLECALCIFEROL) 25 MCG tablet Take 2 tablets (2,000 Units total) by mouth daily. Patient not taking: Reported on 05/25/2024 11/07/23   Johny Lot, MD    Family History History reviewed. No pertinent family  history.  Social History Social History   Tobacco Use   Smoking status: Former    Current packs/day: 0.50    Types: Cigarettes   Smokeless tobacco: Never  Vaping Use   Vaping status: Some Days  Substance Use Topics   Alcohol use: No   Drug use: Yes    Types: Marijuana     Allergies   Patient has no allergy information on record.   Review of Systems Review of Systems Per HPI  Physical Exam Triage Vital Signs ED Triage Vitals  Encounter Vitals Group     BP 08/12/24 1105 128/81     Girls Systolic BP Percentile --      Girls Diastolic BP Percentile --      Boys Systolic BP Percentile --      Boys Diastolic BP Percentile --      Pulse Rate 08/12/24 1105 88     Resp 08/12/24 1105 20     Temp 08/12/24 1105 98.5 F (36.9 C)     Temp Source 08/12/24 1105 Oral     SpO2 08/12/24 1105 98 %     Weight --      Height --      Head Circumference --      Peak Flow --      Pain Score 08/12/24 1106 0     Pain Loc --      Pain Education --      Exclude from Growth Chart --  No data found.  Updated Vital Signs BP 128/81 (BP Location: Right Arm)   Pulse 88   Temp 98.5 F (36.9 C) (Oral)   Resp 20   SpO2 98%   Visual Acuity Right Eye Distance:   Left Eye Distance:   Bilateral Distance:    Right Eye Near:   Left Eye Near:    Bilateral Near:     Physical Exam Vitals and nursing note reviewed.  Constitutional:      Appearance: Normal appearance.  HENT:     Head: Atraumatic.  Eyes:     Extraocular Movements: Extraocular movements intact.     Conjunctiva/sclera: Conjunctivae normal.  Cardiovascular:     Rate and Rhythm: Normal rate.  Pulmonary:     Effort: Pulmonary effort is normal.  Genitourinary:    Comments: GU exam deferred, self swab performed Musculoskeletal:        General: Normal range of motion.     Cervical back: Normal range of motion and neck supple.  Skin:    General: Skin is warm and dry.  Neurological:     Mental Status: He is oriented  to person, place, and time.  Psychiatric:        Mood and Affect: Mood normal.        Thought Content: Thought content normal.        Judgment: Judgment normal.      UC Treatments / Results  Labs (all labs ordered are listed, but only abnormal results are displayed) Labs Reviewed  CYTOLOGY, (ORAL, ANAL, URETHRAL) ANCILLARY ONLY    EKG   Radiology No results found.  Procedures Procedures (including critical care time)  Medications Ordered in UC Medications - No data to display  Initial Impression / Assessment and Plan / UC Course  I have reviewed the triage vital signs and the nursing notes.  Pertinent labs & imaging results that were available during my care of the patient were reviewed by me and considered in my medical decision making (see chart for details).     Cytology swab pending, treat based on results.  Recommended abstinence until results return.  Return for worsening symptoms. Final Clinical Impressions(s) / UC Diagnoses   Final diagnoses:  Screening examination for STI     Discharge Instructions      We will let you know if any of your results from your swab come back abnormal and discuss treatment options.  We do recommend abstinence until these results are available to avoid any new exposures    ED Prescriptions   None    PDMP not reviewed this encounter.   Stuart Vernell Norris, [place] 08/12/24 1135

## 2024-08-13 ENCOUNTER — Ambulatory Visit: Payer: Self-pay

## 2024-08-13 LAB — CYTOLOGY, (ORAL, ANAL, URETHRAL) ANCILLARY ONLY
Chlamydia: POSITIVE — AB
Comment: NEGATIVE
Comment: NEGATIVE
Comment: NORMAL
Neisseria Gonorrhea: NEGATIVE
Trichomonas: NEGATIVE

## 2024-08-13 MED ORDER — DOXYCYCLINE HYCLATE 100 MG PO TABS: 100.0000 mg | ORAL_TABLET | Freq: Two times a day (BID) | ORAL | 0 refills | Status: AC

## 2024-12-01 ENCOUNTER — Encounter (HOSPITAL_COMMUNITY): Payer: Self-pay

## 2024-12-01 ENCOUNTER — Emergency Department (HOSPITAL_COMMUNITY)
Admission: EM | Admit: 2024-12-01 | Discharge: 2024-12-02 | Disposition: A | Attending: Emergency Medicine | Admitting: Emergency Medicine

## 2024-12-01 ENCOUNTER — Other Ambulatory Visit: Payer: Self-pay

## 2024-12-01 DIAGNOSIS — Z8659 Personal history of other mental and behavioral disorders: Secondary | ICD-10-CM | POA: Insufficient documentation

## 2024-12-01 DIAGNOSIS — F29 Unspecified psychosis not due to a substance or known physiological condition: Secondary | ICD-10-CM | POA: Insufficient documentation

## 2024-12-01 DIAGNOSIS — Z79899 Other long term (current) drug therapy: Secondary | ICD-10-CM | POA: Insufficient documentation

## 2024-12-01 LAB — URINE DRUG SCREEN
Amphetamines: NEGATIVE
Barbiturates: NEGATIVE
Benzodiazepines: NEGATIVE
Cocaine: NEGATIVE
Fentanyl: NEGATIVE
Methadone Scn, Ur: NEGATIVE
Opiates: NEGATIVE
Tetrahydrocannabinol: NEGATIVE

## 2024-12-01 LAB — COMPREHENSIVE METABOLIC PANEL WITH GFR
ALT: 7 U/L (ref 0–44)
AST: 19 U/L (ref 15–41)
Albumin: 5 g/dL (ref 3.5–5.0)
Alkaline Phosphatase: 57 U/L (ref 38–126)
Anion gap: 16 — ABNORMAL HIGH (ref 5–15)
BUN: 8 mg/dL (ref 6–20)
CO2: 23 mmol/L (ref 22–32)
Calcium: 9.7 mg/dL (ref 8.9–10.3)
Chloride: 101 mmol/L (ref 98–111)
Creatinine, Ser: 1.05 mg/dL (ref 0.61–1.24)
GFR, Estimated: >60 mL/min (ref >60)
Glucose, Bld: 117 mg/dL — ABNORMAL HIGH (ref 70–99)
Potassium: 3.3 mmol/L — ABNORMAL LOW (ref 3.5–5.1)
Sodium: 140 mmol/L (ref 135–145)
Total Bilirubin: 1.2 mg/dL (ref 0.0–1.2)
Total Protein: 7.7 g/dL (ref 6.5–8.1)

## 2024-12-01 LAB — CBC WITH DIFFERENTIAL/PLATELET
Abs Immature Granulocytes: 0.01 K/uL (ref 0.00–0.07)
Basophils Absolute: 0.1 K/uL (ref 0.0–0.1)
Basophils Relative: 1 %
Eosinophils Absolute: 0.3 K/uL (ref 0.0–0.5)
Eosinophils Relative: 4 %
HCT: 46.2 % (ref 39.0–52.0)
Hemoglobin: 16 g/dL (ref 13.0–17.0)
Immature Granulocytes: 0 %
Lymphocytes Relative: 32 %
Lymphs Abs: 2.4 K/uL (ref 0.7–4.0)
MCH: 30.8 pg (ref 26.0–34.0)
MCHC: 34.6 g/dL (ref 30.0–36.0)
MCV: 89 fL (ref 80.0–100.0)
Monocytes Absolute: 0.7 K/uL (ref 0.1–1.0)
Monocytes Relative: 9 %
Neutro Abs: 3.9 K/uL (ref 1.7–7.7)
Neutrophils Relative %: 54 %
Platelets: 295 K/uL (ref 150–400)
RBC: 5.19 MIL/uL (ref 4.22–5.81)
RDW: 12.2 % (ref 11.5–15.5)
WBC: 7.3 K/uL (ref 4.0–10.5)
nRBC: 0 % (ref 0.0–0.2)

## 2024-12-01 LAB — ETHANOL: Alcohol, Ethyl (B): <15 mg/dL (ref <15)

## 2024-12-01 NOTE — ED Notes (Addendum)
 Per mother, behaviors have been escalating over the last 3-4 days. Says that he wont take his meds. Was on injections but when he went to jail they stopped giving it to him.

## 2024-12-01 NOTE — ED Triage Notes (Signed)
 Pt under IVC- papers state pt is schizophrenic and refuses to take his medications for the last three days. States he is in a manic state and has been combative with his father and accused parents of trying to harm him. Concerned for his safety and safety of others.

## 2024-12-01 NOTE — ED Notes (Signed)
 Pt's clothes, shoes, and phone are in locker 4.

## 2024-12-02 ENCOUNTER — Encounter (HOSPITAL_COMMUNITY): Payer: Self-pay | Admitting: Behavioral Health

## 2024-12-02 ENCOUNTER — Inpatient Hospital Stay (HOSPITAL_COMMUNITY)
Admission: AD | Admit: 2024-12-02 | Discharge: 2024-12-09 | DRG: 885 | Disposition: A | Source: Intra-hospital | Admitting: Behavioral Health

## 2024-12-02 DIAGNOSIS — F209 Schizophrenia, unspecified: Secondary | ICD-10-CM | POA: Diagnosis not present

## 2024-12-02 MED ORDER — OLANZAPINE 5 MG PO TABS
5.0000 mg | ORAL_TABLET | Freq: Once | ORAL | Status: AC
  Administered 2024-12-02: 5 mg via ORAL
  Filled 2024-12-02: qty 1

## 2024-12-02 MED ORDER — OLANZAPINE 7.5 MG PO TABS
15.0000 mg | ORAL_TABLET | Freq: Every day | ORAL | Status: DC
  Administered 2024-12-02 – 2024-12-05 (×4): 15 mg via ORAL
  Filled 2024-12-02 (×4): qty 2

## 2024-12-02 MED ORDER — DIPHENHYDRAMINE HCL 25 MG PO CAPS: 50.0000 mg | ORAL_CAPSULE | Freq: Three times a day (TID) | ORAL | Status: DC | PRN

## 2024-12-02 MED ORDER — HALOPERIDOL 5 MG PO TABS: 5.0000 mg | ORAL_TABLET | Freq: Three times a day (TID) | ORAL | Status: DC | PRN

## 2024-12-02 MED ORDER — SERTRALINE HCL 50 MG PO TABS
50.0000 mg | ORAL_TABLET | Freq: Every day | ORAL | Status: DC
  Administered 2024-12-03 – 2024-12-09 (×7): 50 mg via ORAL
  Filled 2024-12-02 (×7): qty 1

## 2024-12-02 MED ORDER — LORAZEPAM 2 MG/ML IJ SOLN: 2.0000 mg | Freq: Three times a day (TID) | INTRAMUSCULAR | Status: DC | PRN

## 2024-12-02 MED ORDER — DIPHENHYDRAMINE HCL 50 MG/ML IJ SOLN: 50.0000 mg | Freq: Three times a day (TID) | INTRAMUSCULAR | Status: DC | PRN

## 2024-12-02 MED ORDER — MAGNESIUM HYDROXIDE 400 MG/5ML PO SUSP: 30.0000 mL | Freq: Every day | ORAL | Status: DC | PRN

## 2024-12-02 MED ORDER — TRAZODONE HCL 50 MG PO TABS
50.0000 mg | ORAL_TABLET | Freq: Every evening | ORAL | Status: DC | PRN
  Administered 2024-12-07: 50 mg via ORAL
  Filled 2024-12-02 (×4): qty 1

## 2024-12-02 MED ORDER — ALUM & MAG HYDROXIDE-SIMETH 200-200-20 MG/5ML PO SUSP: 30.0000 mL | ORAL | Status: DC | PRN

## 2024-12-02 MED ORDER — OLANZAPINE 5 MG PO TABS: 15.0000 mg | ORAL_TABLET | Freq: Every day | ORAL | Status: DC

## 2024-12-02 MED ORDER — ACETAMINOPHEN 325 MG PO TABS: 650.0000 mg | ORAL_TABLET | Freq: Four times a day (QID) | ORAL | Status: DC | PRN

## 2024-12-02 MED ORDER — SERTRALINE HCL 50 MG PO TABS
50.0000 mg | ORAL_TABLET | Freq: Every day | ORAL | Status: DC
  Administered 2024-12-02: 50 mg via ORAL
  Filled 2024-12-02: qty 1

## 2024-12-02 MED ORDER — HALOPERIDOL LACTATE 5 MG/ML IJ SOLN: 5.0000 mg | Freq: Three times a day (TID) | INTRAMUSCULAR | Status: DC | PRN

## 2024-12-02 MED ORDER — HALOPERIDOL LACTATE 5 MG/ML IJ SOLN: 10.0000 mg | Freq: Three times a day (TID) | INTRAMUSCULAR | Status: DC | PRN

## 2024-12-02 NOTE — Progress Notes (Signed)
 Pt has been accepted to [facility] on 12/02/2024. Bed assignment: 405-1  Pt meets inpatient criteria per, Wyline Pizza, NP  Attending Physician will be Marolyn Rosser, MD  Report can be called to: - Adult unit: [phone]  Pt can arrive after [facility] WILL UPDATE  Care Team Notified: [facility] , Morna Qua, RN, Wyline Pizza, NP  [place] Erna Brossard LCSW-A   12/02/2024 11:04 AM

## 2024-12-02 NOTE — ED Provider Notes (Signed)
 [place] EMERGENCY DEPARTMENT AT [facility] Provider Note   CSN: [id number] Arrival date & time: 12/01/24  2233     Patient presents with: No chief complaint on file.   Omar Owen is a 23 y.o. male.   Patient brought to the emergency department under involuntary commitment initiated by family.  Patient is reportedly schizophrenic and has not been taking his medications for a number of days.  Family concerned about his erratic behavior including reporting that he has been trying to harm them.  At arrival, patient is in police custody.  He is calm, reports that he does not know why he is here.       Prior to Admission medications   Medication Sig Start Date End Date Taking? Authorizing Provider  hydrOXYzine  (ATARAX ) 25 MG tablet Take 1 tablet (25 mg total) by mouth 3 (three) times daily as needed for anxiety. Patient not taking: Reported on 05/25/2024 11/06/23   Johny Lot, MD  nicotine  polacrilex (NICORETTE ) 2 MG gum Take 1 each (2 mg total) by mouth as needed for smoking cessation. Patient not taking: Reported on 05/25/2024 11/06/23   Johny Lot, MD  OLANZapine  (ZYPREXA ) 15 MG tablet Take 1 tablet (15 mg total) by mouth at bedtime. Patient not taking: Reported on 05/25/2024 11/07/23   Johny Lot, MD  sertraline  (ZOLOFT ) 50 MG tablet Take 1 tablet (50 mg total) by mouth daily. Patient not taking: Reported on 05/25/2024 11/07/23 12/07/23  Massengill, Nathan, MD  vitamin D3 (CHOLECALCIFEROL ) 25 MCG tablet Take 2 tablets (2,000 Units total) by mouth daily. Patient not taking: Reported on 05/25/2024 11/07/23   Johny Lot, MD    Allergies: Patient has no allergy information on record.    Review of Systems  Updated Vital Signs BP (!) 128/91 (BP Location: Right Arm)   Pulse 98   Temp 97.8 F (36.6 C) (Oral)   Resp 18   SpO2 98%   Physical Exam Vitals and nursing note reviewed.  Constitutional:      General: He is not in acute distress.     Appearance: He is well-developed.  HENT:     Head: Normocephalic and atraumatic.     Mouth/Throat:     Mouth: Mucous membranes are moist.  Eyes:     General: Vision grossly intact. Gaze aligned appropriately.     Extraocular Movements: Extraocular movements intact.     Conjunctiva/sclera: Conjunctivae normal.  Cardiovascular:     Rate and Rhythm: Normal rate and regular rhythm.     Pulses: Normal pulses.     Heart sounds: Normal heart sounds, S1 normal and S2 normal. No murmur heard.    No friction rub. No gallop.  Pulmonary:     Effort: Pulmonary effort is normal. No respiratory distress.     Breath sounds: Normal breath sounds.  Abdominal:     Palpations: Abdomen is soft.     Tenderness: There is no abdominal tenderness. There is no guarding or rebound.     Hernia: No hernia is present.  Musculoskeletal:        General: No swelling.     Cervical back: Full passive range of motion without pain, normal range of motion and neck supple. No pain with movement, spinous process tenderness or muscular tenderness. Normal range of motion.     Right lower leg: No edema.     Left lower leg: No edema.  Skin:    General: Skin is warm and dry.     Capillary Refill: Capillary  refill takes less than 2 seconds.     Findings: No ecchymosis, erythema, lesion or wound.  Neurological:     Mental Status: He is alert and oriented to person, place, and time.     GCS: GCS eye subscore is 4. GCS verbal subscore is 5. GCS motor subscore is 6.     Cranial Nerves: Cranial nerves 2-12 are intact.     Sensory: Sensation is intact.     Motor: Motor function is intact. No weakness or abnormal muscle tone.     Coordination: Coordination is intact.  Psychiatric:        Mood and Affect: Mood normal.        Speech: He is noncommunicative.        Behavior: Behavior is withdrawn.     (all labs ordered are listed, but only abnormal results are displayed) Labs Reviewed  COMPREHENSIVE METABOLIC PANEL WITH GFR -  Abnormal; Notable for the following components:      Result Value   Potassium 3.3 (*)    Glucose, Bld 117 (*)    Anion gap 16 (*)    All other components within normal limits  CBC WITH DIFFERENTIAL/PLATELET  ETHANOL  URINE DRUG SCREEN    EKG: None  Radiology: No results found.   Procedures   Medications Ordered in the ED  OLANZapine  (ZYPREXA ) tablet 15 mg (has no administration in time range)  sertraline  (ZOLOFT ) tablet 50 mg (has no administration in time range)                                    Medical Decision Making Risk Prescription drug management.   Presents under IVC.  Patient with psychiatric history, not taking his medications.  Family members were feeling threatened by his behavior, will require psychiatric evaluation.  Medically cleared for treatment.     Final diagnoses:  Psychosis, unspecified psychosis type [facility])    ED Discharge Orders     None          Eulalio Reamy, Lonni PARAS, MD 12/02/24 0004

## 2024-12-02 NOTE — Group Note (Signed)
 Date:  12/02/2024 Time:  7:10 PM  Group Topic/Focus: Emotional Wellness  Emotional Education:   The focus of this group is to discuss what feelings/emotions are, and how they are experienced.   The group began with an emotional check-in and pulse check as an research scientist (life sciences). Patients then participated in an Emotion Bingo activity, where they were provided bingo cards listing various emotions. Emotions were called at random, and patients selected emotions to discuss how each feeling affects them and the actions or behaviors linked to that emotion. A gold star was awarded for bingo completion, and prompting questions such as "Have you ever experienced this emotion?" were used to encourage reflection and sharing. The group discussion highlighted the universality of emotions, the differences in how they may appear, and the importance of giving grace to others. In conclusion, patients were reminded that naming emotions increases awareness and provides the power to control how emotions are expressed.   Participation Level:  Did Not Attend   Omar Owen 12/02/2024, 7:10 PM

## 2024-12-02 NOTE — Plan of Care (Signed)
   Problem: Education: Goal: Knowledge of [place] [facility] Education information/materials will improve Outcome: Progressing Goal: Emotional status will improve Outcome: Progressing Goal: Mental status will improve Outcome: Progressing
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
## 2024-12-02 NOTE — Tx Team (Signed)
 Initial Treatment Plan 12/02/2024 5:27 PM Omar Owen FMW:[id number]    PATIENT STRESSORS: Marital or family conflict   Medication change or noncompliance     PATIENT STRENGTHS: Supportive family/friends    PATIENT IDENTIFIED PROBLEMS: Medication noncompliance                     DISCHARGE CRITERIA:  Improved stabilization in mood, thinking, and/or behavior Reduction of life-threatening or endangering symptoms to within safe limits  PRELIMINARY DISCHARGE PLAN: Return to previous living arrangement  PATIENT/FAMILY INVOLVEMENT: This treatment plan has been presented to and reviewed with the patient, Omar Owen, and/or family member.  The patient and family have been given the opportunity to ask questions and make suggestions.  Asberry LITTIE Givens, RN 12/02/2024, 5:27 PM

## 2024-12-02 NOTE — Group Note (Signed)
 Date:  12/02/2024 Time:  3:39 PM  Group Topic/Focus: Occupational therapy Healthy Communication:   The focus of this group is to discuss communication, barriers to communication, as well as healthy ways to communicate with others.    Participation Level:  Did Not Attend   Omar Owen 12/02/2024, 3:39 PM

## 2024-12-02 NOTE — ED Notes (Signed)
 Patient presents to ED with  [facility] Department with IVC paperwork. EDP has done First Exam. All paperwork loaded into patient's chart.

## 2024-12-02 NOTE — BHH Group Notes (Signed)
Noar did not attend wrap up group.

## 2024-12-02 NOTE — Plan of Care (Signed)

## 2024-12-02 NOTE — ED Notes (Signed)
 Patient calm and cooperative. Plan in place for Inpatient psychiatric admission per [facility] specialists. Police cleared to leave at this time.

## 2024-12-02 NOTE — Progress Notes (Signed)
 Pt admitted today. Pt cooperated with skin search, minimally cooperative with assessment. Pt walked out of search room during assessment. Pt attempted to pocket vape while in search room before walking out, did give to mht when instructed. Pt presents with irritable affect. When asked reason for admission pt states I dont know. Because my parents fight and can't get their shit together. Pt denies SI/HI/AVH. Pt states he lives at home with his parents. Pt states after his last hospitalization he did not attend any follow up or take medication. Pt ate food tray per his request and is laying down in bed.

## 2024-12-03 ENCOUNTER — Encounter (HOSPITAL_COMMUNITY): Payer: Self-pay

## 2024-12-03 DIAGNOSIS — F209 Schizophrenia, unspecified: Principal | ICD-10-CM

## 2024-12-03 LAB — TSH: TSH: 1.24 u[IU]/mL (ref 0.350–4.500)

## 2024-12-03 LAB — LIPID PANEL
Cholesterol: 139 mg/dL (ref 0–200)
HDL: 42 mg/dL (ref >40)
LDL Cholesterol: 86 mg/dL (ref 0–99)
Total CHOL/HDL Ratio: 3.3 ratio
Triglycerides: 57 mg/dL (ref <150)
VLDL: 11 mg/dL (ref 0–40)

## 2024-12-03 MED ORDER — POTASSIUM CHLORIDE CRYS ER 20 MEQ PO TBCR
40.0000 meq | EXTENDED_RELEASE_TABLET | Freq: Once | ORAL | Status: AC
  Administered 2024-12-03: 40 meq via ORAL
  Filled 2024-12-03: qty 2

## 2024-12-03 MED ORDER — NICOTINE POLACRILEX 2 MG MT GUM
2.0000 mg | CHEWING_GUM | OROMUCOSAL | Status: DC | PRN
  Administered 2024-12-04 – 2024-12-09 (×23): 2 mg via ORAL
  Filled 2024-12-03 (×10): qty 1

## 2024-12-03 NOTE — BH IP Treatment Plan (Signed)
 Interdisciplinary Treatment and Diagnostic Plan Update  12/03/2024 Time of Session: 10:20 AM Omar Owen MRN: [id number]  Principal Diagnosis: Schizophrenia [facility])  Secondary Diagnoses: Principal Problem:   Schizophrenia (HCC)   Current Medications:  Current Facility-Administered Medications  Medication Dose Route Frequency Provider Last Rate Last Admin   acetaminophen  (TYLENOL ) tablet 650 mg  650 mg Oral Q6H PRN White, Patrice L, NP       alum & mag hydroxide-simeth (MAALOX/MYLANTA) 200-200-20 MG/5ML suspension 30 mL  30 mL Oral Q4H PRN White, Patrice L, NP       haloperidol  (HALDOL ) tablet 5 mg  5 mg Oral TID PRN White, Patrice L, NP       And   diphenhydrAMINE  (BENADRYL ) capsule 50 mg  50 mg Oral TID PRN White, Patrice L, NP       haloperidol  lactate (HALDOL ) injection 5 mg  5 mg Intramuscular TID PRN White, Patrice L, NP       And   diphenhydrAMINE  (BENADRYL ) injection 50 mg  50 mg Intramuscular TID PRN White, Patrice L, NP       And   LORazepam  (ATIVAN ) injection 2 mg  2 mg Intramuscular TID PRN White, Patrice L, NP       haloperidol  lactate (HALDOL ) injection 10 mg  10 mg Intramuscular TID PRN White, Patrice L, NP       And   diphenhydrAMINE  (BENADRYL ) injection 50 mg  50 mg Intramuscular TID PRN White, Patrice L, NP       And   LORazepam  (ATIVAN ) injection 2 mg  2 mg Intramuscular TID PRN White, Patrice L, NP       magnesium  hydroxide (MILK OF MAGNESIA) suspension 30 mL  30 mL Oral Daily PRN White, Patrice L, NP       OLANZapine  (ZYPREXA ) tablet 15 mg  15 mg Oral QHS White, Patrice L, NP   15 mg at 12/02/24 2149   sertraline  (ZOLOFT ) tablet 50 mg  50 mg Oral Daily White, Patrice L, NP   50 mg at 12/03/24 9158   traZODone  (DESYREL ) tablet 50 mg  50 mg Oral QHS PRN White, Patrice L, NP       PTA Medications: Medications Prior to Admission  Medication Sig Dispense Refill Last Dose/Taking   hydrOXYzine  (ATARAX ) 25 MG tablet Take 1 tablet (25 mg total) by mouth 3 (three) times  daily as needed for anxiety. (Patient not taking: Reported on 05/25/2024) 30 tablet 0    OLANZapine  (ZYPREXA ) 15 MG tablet Take 1 tablet (15 mg total) by mouth at bedtime. (Patient not taking: Reported on 05/25/2024) 30 tablet 0    sertraline  (ZOLOFT ) 50 MG tablet Take 1 tablet (50 mg total) by mouth daily. (Patient not taking: Reported on 05/25/2024) 30 tablet 0     Patient Stressors: Marital or family conflict   Medication change or noncompliance    Patient Strengths: Supportive family/friends   Treatment Modalities: Medication Management, Group therapy, Case management,  1 to 1 session with clinician, Psychoeducation, Recreational therapy.   Physician Treatment Plan for Primary Diagnosis: Schizophrenia (HCC) Long Term Goal(s):     Short Term Goals:    Medication Management: Evaluate patient's response, side effects, and tolerance of medication regimen.  Therapeutic Interventions: 1 to 1 sessions, Unit Group sessions and Medication administration.  Evaluation of Outcomes: Not Progressing  Physician Treatment Plan for Secondary Diagnosis: Principal Problem:   Schizophrenia (HCC)  Long Term Goal(s):     Short Term Goals:  Medication Management: Evaluate patient's response, side effects, and tolerance of medication regimen.  Therapeutic Interventions: 1 to 1 sessions, Unit Group sessions and Medication administration.  Evaluation of Outcomes: Not Progressing   RN Treatment Plan for Primary Diagnosis: Schizophrenia (HCC) Long Term Goal(s): Knowledge of disease and therapeutic regimen to maintain health will improve  Short Term Goals: Ability to remain free from injury will improve, Ability to verbalize frustration and anger appropriately will improve, Ability to demonstrate self-control, Ability to participate in decision making will improve, Ability to verbalize feelings will improve, Ability to disclose and discuss suicidal ideas, Ability to identify and develop effective  coping behaviors will improve, and Compliance with prescribed medications will improve  Medication Management: RN will administer medications as ordered by provider, will assess and evaluate patient's response and provide education to patient for prescribed medication. RN will report any adverse and/or side effects to prescribing provider.  Therapeutic Interventions: 1 on 1 counseling sessions, Psychoeducation, Medication administration, Evaluate responses to treatment, Monitor vital signs and CBGs as ordered, Perform/monitor CIWA, COWS, AIMS and Fall Risk screenings as ordered, Perform wound care treatments as ordered.  Evaluation of Outcomes: Not Progressing   LCSW Treatment Plan for Primary Diagnosis: Schizophrenia (HCC) Long Term Goal(s): Safe transition to appropriate next level of care at discharge, Engage patient in therapeutic group addressing interpersonal concerns.  Short Term Goals: Engage patient in aftercare planning with referrals and resources, Increase social support, Increase ability to appropriately verbalize feelings, Increase emotional regulation, Facilitate acceptance of mental health diagnosis and concerns, Facilitate patient progression through stages of change regarding substance use diagnoses and concerns, Identify triggers associated with mental health/substance abuse issues, and Increase skills for wellness and recovery  Therapeutic Interventions: Assess for all discharge needs, 1 to 1 time with Social worker, Explore available resources and support systems, Assess for adequacy in community support network, Educate family and significant other(s) on suicide prevention, Complete Psychosocial Assessment, Interpersonal group therapy.  Evaluation of Outcomes: Not Progressing   Progress in Treatment: Attending groups: No. Participating in groups: No. Taking medication as prescribed: Yes. Toleration medication: Yes. Family/Significant other contact made: No, will contact:   patient declined to provide consents Patient understands diagnosis: Yes. Discussing patient identified problems/goals with staff: Yes. Medical problems stabilized or resolved: Yes. Denies suicidal/homicidal ideation: Yes. Issues/concerns per patient self-inventory: No.  New problem(s) identified:  No  New Short Term/Long Term Goal(s):    medication stabilization, elimination of SI thoughts, development of comprehensive mental wellness plan.    Patient Goals:  I want to work on my discharge.  Discharge Plan or Barriers:  Patient recently admitted. CSW will continue to follow and assess for appropriate referrals and possible discharge planning.    Reason for Continuation of Hospitalization: Depression Mania Medication stabilization  Estimated Length of Stay:  5 - 7 days  Last 3 [place] Suicide Severity Risk Score: Flowsheet Row [facility] (Current[facility]/2024 [facility] Department at [facility] UC from 08/12/2024 in [facility] at [facility] RISK CATEGORY No Risk No Risk No Risk    Last PHQ 2/9 Scores:     No data to display          Scribe for Treatment Team: Brithany Whitworth O Alysah Carton, LCSWA 12/03/2024 12:39 PM

## 2024-12-03 NOTE — Group Note (Unsigned)
 Date:  12/03/2024 Time:  8:26 PM  Group Topic/Focus:  Wrap-Up Group:   The focus of this group is to help patients review their daily goal of treatment and discuss progress on daily workbooks.     Participation Level:  {BHH PARTICIPATION OZCZO:77735}  Participation Quality:  {BHH PARTICIPATION QUALITY:22265}  Affect:  {BHH AFFECT:22266}  Cognitive:  {BHH COGNITIVE:22267}  Insight: {BHH Insight2:20797}  Engagement in Group:  {BHH ENGAGEMENT IN HMNLE:77731}  Modes of Intervention:  {BHH MODES OF INTERVENTION:22269}  Additional Comments:  ***  Gwenn Nobie [place] 12/03/2024, 8:26 PM

## 2024-12-03 NOTE — Group Note (Signed)
 Recreation Therapy Group Note   Group Topic:Communication  Group Date: 12/03/2024 Start Time: 0933 End Time: 1025 Facilitators: Omar Owen, LRT,CTRS Location: [address] Day[address]   Group Topic: Communication, Problem Solving   Goal Area(s) Addresses:  Patient will effectively listen to complete activity.  Patient will identify communication skills used to make activity successful.  Patient will identify how skills used during activity can be used to reach post d/c goals.    Behavioral Response:    Intervention: Building Surveyor Activity - Geometric pattern cards, pencils, blank paper    Activity: Geometric Drawings.  Three volunteers from the peer group will be shown an abstract picture with a particular arrangement of geometrical shapes.  Each round, one 'speaker' will describe the pattern, as accurately as possible without revealing the image to the group.  The remaining group members will listen and draw the picture to reflect how it is described to them. Patients with the role of 'listener' cannot ask clarifying questions but, may request that the speaker repeat a direction. Once the drawings are complete, the presenter will show the rest of the group the picture and compare how close each person came to drawing the picture. LRT will facilitate a post-activity discussion regarding effective communication and the importance of planning, listening, and asking for clarification in daily interactions with others.  Education: Environmental consultant, Active listening, Support systems, Discharge planning  Education Outcome: Acknowledges understanding/In group clarification offered/Needs additional education.    Affect/Mood: N/A   Participation Level: Did not attend    Clinical Observations/Individualized Feedback:     Plan: Continue to engage patient in RT group sessions 2-3x/week.   Omar Owen, LRT,CTRS 12/03/2024 12:36 PM
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
## 2024-12-03 NOTE — Group Note (Signed)
 Date:  12/03/2024 Time:  12:54 PM  Group Topic/Focus: Social Wellness Fostering connection, teamwork, communication, and stress-relief through a fun and engaging group activity. Enhance Social Connection: Encourage participants to interact, communicate, and bond in a fun, low-pressure environment. Promote Teamwork and Collaboration: Jerrye cooperative problem-solving as players work together to united stationers and support their teammates. Boost Communication Skills: Improve non-verbal communication, creativity, and active listening through drawing and interpreting illustrations. Increase Engagement and Participation: Provide an enjoyable activity that motivates all group members to participate regardless of drawing ability. Encourage Stress Relief and Playfulness: Create a relaxed, enjoyable atmosphere that supports emotional well-being through laughter and play. Strengthen Group Cohesion: Build trust and camaraderie, helping members feel more comfortable and connected within the group.  Participation Level:  Did Not Attend  Daruis Swaim R Roldan Laforest 12/03/2024, 12:54 PM
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
## 2024-12-03 NOTE — Group Note (Signed)
 Date:  12/03/2024 Time:  10:35 AM  Group Topic/Focus: Recreational Therapy    Pt did not attend recreational therapy group  Zeyad Delaguila R Aijalon Demuro 12/03/2024, 10:35 AM

## 2024-12-03 NOTE — BHH Suicide Risk Assessment (Signed)
 BHH INPATIENT:  Family/Significant Other Suicide Prevention Education  Suicide Prevention Education:  Patient Refusal for Family/Significant Other Suicide Prevention Education: The patient Omar Owen has refused to provide written consent for family/significant other to be provided Family/Significant Other Suicide Prevention Education during admission and/or prior to discharge.  Physician notified.  Jenkins LULLA Primer 12/03/2024, 1:37 PM

## 2024-12-03 NOTE — Group Note (Signed)
 Date:  12/03/2024 Time:  7:17 PM  Group Topic/Focus:  Gut flora and it's impact on behavioral health    Participation Level:  Did Not Attend   Omar Owen 12/03/2024, 7:17 PM

## 2024-12-03 NOTE — Plan of Care (Addendum)
 Pt presents with flat affect, depressed mood, fair eye contact and soft speech; circumstantial speech, hyper-religious in nature. Reports she slept well last night with good appetite. Per pt I just said God, my girlfriend kept asking why. I said women don't read the Bible when they're praying; she got angry. I'm worried about her, she's pregnant.  Guarded, isolative to room majority of this shift. Refused to attend scheduled groups despite multiple prompts. Off unit for meals to cafeteria; returned without issues. Emotional support, encouragement and reassurance. Q 15 minutes safety checks maintained without incident.  Problem: Coping: Goal: Ability to demonstrate self-control will improve Outcome: Progressing   Problem: Activity: Goal: Interest or engagement in activities will improve Outcome: Not Progressing

## 2024-12-03 NOTE — Progress Notes (Signed)
(  Sleep Hours) -8.25 as of 0530 (Any PRNs that were needed, meds refused, or side effects to meds)- none (Any disturbances and when (visitation, over night)-none (Concerns raised by the patient)- none (SI/HI/AVH)- denies all

## 2024-12-03 NOTE — Group Note (Signed)
 Date:  12/03/2024 Time:  9:50 AM  Group Topic/Focus:  Goals Group:   The focus of this group is to help patients establish daily goals to achieve during treatment and discuss how the patient can incorporate goal setting into their daily lives to aide in recovery. Orientation:   The focus of this group is to educate the patient on the purpose and policies of crisis stabilization and provide a format to answer questions about their admission.  The group details unit policies and expectations of patients while admitted.    Participation Level:  Did Not Attend   Colbi Staubs R Rafael Quesada 12/03/2024, 9:50 AM
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
## 2024-12-04 LAB — URINALYSIS, COMPLETE (UACMP) WITH MICROSCOPIC
Bilirubin Urine: NEGATIVE
Glucose, UA: NEGATIVE mg/dL
Hgb urine dipstick: NEGATIVE
Ketones, ur: NEGATIVE mg/dL
Nitrite: NEGATIVE
Protein, ur: NEGATIVE mg/dL
Specific Gravity, Urine: 1.028 (ref 1.005–1.030)
pH: 5 (ref 5.0–8.0)

## 2024-12-04 LAB — HEMOGLOBIN A1C
Hgb A1c MFr Bld: 5 % (ref 4.8–5.6)
Mean Plasma Glucose: 97 mg/dL

## 2024-12-04 NOTE — Progress Notes (Addendum)
 [facility] MD Progress Note  12/04/2024 1:19 PM Omar Owen  MRN:  [id number]  Principal Problem: Schizophrenia (HCC) Diagnosis: Principal Problem:   Schizophrenia (HCC)   Reason for Admission:  Omar Owen is a 23 yo male w/ hx of MDD, schizophrenia, substance induced psychosis, ADHD admitted under IVC from [facility] due to worsening psychosis in setting of medication nonadherence (admitted on 12/02/2024, total  LOS: 2 days ).   ASSESSMENT: Patient presents flat and hyperreligious on evaluation. He has limited insight, poor judgment, and significant negative symptoms. His symptoms are likely most consistent with schizophrenia given negative UDS and persistent psychotic symptoms. May require a LAI to ensure medication compliance.  PLAN: #Schizophrenia #Hx of MDD #Hx of substance induced psychosis -continue olanzapine  15 mg at bedtime  -hydroxyzine  25 mg tid prn for anxiety -sertraline  50 mg daily -trazodone  50 mg at bedtime prn for insomnia  Disposition Planning: -- Estimated LOS: 5-7 days --Estimated Discharge Date 12/10/24 --Barriers to Discharge: acutely psychotic -- Discharge Goals: Return home with outpatient referrals for mental health follow-up including medication management/psychotherapy  Subjective: The patient was seen and evaluated on the unit. On assessment today the patient reports still not knowing why he is in the hospital. He is regularly accusing family of sending him to hospital for no reason. He explains he has been informed by the bible as why he has been doing everything that he has. Denies SI/HI/AVH but appears significantly minimizing symptoms.     Objective: Chart Review from last 24 hours:  The patient's chart was reviewed and nursing notes were reviewed. The patient's case was discussed in multidisciplinary team meeting.  - Overnight events to report per chart review / staff report: none - Patient took all prescribed medications yes - Patient  received the following PRNs: nicorette  gum  Current Medications: Current Facility-Administered Medications  Medication Dose Route Frequency Provider Last Rate Last Admin   acetaminophen  (TYLENOL ) tablet 650 mg  650 mg Oral Q6H PRN White, Patrice L, NP       alum & mag hydroxide-simeth (MAALOX/MYLANTA) 200-200-20 MG/5ML suspension 30 mL  30 mL Oral Q4H PRN White, Patrice L, NP       haloperidol  (HALDOL ) tablet 5 mg  5 mg Oral TID PRN White, Patrice L, NP       And   diphenhydrAMINE  (BENADRYL ) capsule 50 mg  50 mg Oral TID PRN White, Patrice L, NP       haloperidol  lactate (HALDOL ) injection 5 mg  5 mg Intramuscular TID PRN White, Patrice L, NP       And   diphenhydrAMINE  (BENADRYL ) injection 50 mg  50 mg Intramuscular TID PRN White, Patrice L, NP       And   LORazepam  (ATIVAN ) injection 2 mg  2 mg Intramuscular TID PRN White, Patrice L, NP       haloperidol  lactate (HALDOL ) injection 10 mg  10 mg Intramuscular TID PRN White, Patrice L, NP       And   diphenhydrAMINE  (BENADRYL ) injection 50 mg  50 mg Intramuscular TID PRN White, Patrice L, NP       And   LORazepam  (ATIVAN ) injection 2 mg  2 mg Intramuscular TID PRN White, Patrice L, NP       magnesium  hydroxide (MILK OF MAGNESIA) suspension 30 mL  30 mL Oral Daily PRN White, Patrice L, NP       nicotine  polacrilex (NICORETTE ) gum 2 mg  2 mg Oral PRN Pashayan, Alexander S, DO  2 mg at 12/04/24 1028   OLANZapine  (ZYPREXA ) tablet 15 mg  15 mg Oral QHS White, Patrice L, NP   15 mg at 12/03/24 2138   sertraline  (ZOLOFT ) tablet 50 mg  50 mg Oral Daily White, Patrice L, NP   50 mg at 12/04/24 0839   traZODone  (DESYREL ) tablet 50 mg  50 mg Oral QHS PRN Teresa Wyline CROME, NP        Lab Results:  Results for orders placed or performed during the hospital encounter of 12/02/24 (from the past 48 hours)  Lipid panel     Status: None   Collection Time: 12/03/24  6:31 AM  Result Value Ref Range   Cholesterol 139 0 - 200 mg/dL    Comment:        ATP  III CLASSIFICATION:  <200     mg/dL   Desirable  799-760  mg/dL   Borderline High  >=759    mg/dL   High           Triglycerides 57 <150 mg/dL   HDL 42 >59 mg/dL   Total CHOL/HDL Ratio 3.3 RATIO   VLDL 11 0 - 40 mg/dL   LDL Cholesterol 86 0 - 99 mg/dL    Comment:        Total Cholesterol/HDL:CHD Risk Coronary Heart Disease Risk Table                     Men   Women  1/2 Average Risk   3.4   3.3  Average Risk       5.0   4.4  2 X Average Risk   9.6   7.1  3 X Average Risk  23.4   11.0        Use the calculated Patient Ratio above and the CHD Risk Table to determine the patient's CHD Risk.        ATP III CLASSIFICATION (LDL):  <100     mg/dL   Optimal  899-870  mg/dL   Near or Above                    Optimal  130-159  mg/dL   Borderline  839-810  mg/dL   High  >809     mg/dL   Very High Performed at [facility], [address], [place], [place] [address]   Hemoglobin A1c     Status: None   Collection Time: 12/03/24  6:31 AM  Result Value Ref Range   Hgb A1c MFr Bld 5.0 4.8 - 5.6 %    Comment: (NOTE)         Prediabetes: 5.7 - 6.4         Diabetes: >6.4         Glycemic control for adults with diabetes: <7.0    Mean Plasma Glucose 97 mg/dL    Comment: (NOTE) Performed At: [facility] [address] [place], [place] [id number] Jennette Shorter MD Ey:[id number]   TSH     Status: None   Collection Time: 12/03/24  6:31 AM  Result Value Ref Range   TSH 1.240 0.350 - 4.500 uIU/mL    Comment: Performed at [facility], [address], [place], [place] [address]  Urinalysis, Complete w Microscopic -Urine, Clean Catch     Status: Abnormal   Collection Time: 12/04/24  6:42 AM  Result Value Ref Range   Color, Urine YELLOW YELLOW   APPearance TURBID (A) CLEAR  Specific Gravity, Urine 1.028 1.005 - 1.030   pH 5.0 5.0 - 8.0   Glucose, UA NEGATIVE NEGATIVE mg/dL   Hgb urine dipstick NEGATIVE NEGATIVE   Bilirubin Urine  NEGATIVE NEGATIVE   Ketones, ur NEGATIVE NEGATIVE mg/dL   Protein, ur NEGATIVE NEGATIVE mg/dL   Nitrite NEGATIVE NEGATIVE   Leukocytes,Ua TRACE (A) NEGATIVE   RBC / HPF 0-5 0 - 5 RBC/hpf   WBC, UA 11-20 0 - 5 WBC/hpf   Bacteria, UA MANY (A) NONE SEEN   Squamous Epithelial / HPF 0-5 0 - 5 /HPF   Mucus PRESENT     Comment: Performed at [facility], [address] [place], [place] [address]    Blood Alcohol level:  Lab Results  Component Value Date   [facility] <15 12/01/2024   ETH <15 05/25/2024    Metabolic Labs: Lab Results  Component Value Date   HGBA1C 5.0 12/03/2024   MPG 97 12/03/2024   MPG 105.41 10/29/2023   No results found for: PROLACTIN Lab Results  Component Value Date   CHOL 139 12/03/2024   TRIG 57 12/03/2024   HDL 42 12/03/2024   CHOLHDL 3.3 12/03/2024   VLDL 11 12/03/2024   LDLCALC 86 12/03/2024   LDLCALC 83 10/29/2023    Physical Findings: AIMS: No  CIWA:    COWS:     Psychiatric Specialty Exam: General Appearance: Appropriate for Environment; Casual   Eye Contact: Fair   Speech: Clear and Coherent   Volume: Normal   Mood: Anxious; Depressed   Affect: Appropriate; Congruent; Blunt   Thought Content: Paranoid Ideation; Illogical (Medication is not working, so I stay away from it.)   Suicidal Thoughts: Suicidal Thoughts: No   Homicidal Thoughts: Homicidal Thoughts: No   Thought Process: Linear   Orientation: Full (Time, Place and Person)     Memory: Immediate Fair; Recent Fair   Judgment: Poor   Insight: Poor   Concentration: Fair   Recall: Poor   Fund of Knowledge: Fair   Language: Fair   Psychomotor Activity: Psychomotor Activity: Normal   Assets: Manufacturing Systems Engineer; Physical Health; Resilience   Sleep: Sleep: Good Number of Hours of Sleep: 8.25    Review of Systems ROS  Vital Signs: Blood pressure 119/89, pulse 65, temperature 97.6 F (36.4 C), temperature source Oral, resp. rate 16,  height 6' (1.829 m), weight 61.7 kg, SpO2 97%. Body mass index is 18.44 kg/m. Physical Exam  I certify that inpatient services furnished can reasonably be expected to improve the patient's condition.   Signed: Prentice Espy, MD 12/04/2024, 1:19 PM

## 2024-12-04 NOTE — Plan of Care (Signed)
   Problem: Education: Goal: Knowledge of [facility] Education information/materials will improve Outcome: Progressing Goal: Emotional status will improve Outcome: Progressing Goal: Mental status will improve Outcome: Progressing Goal: Verbalization of understanding the information provided will improve Outcome: Progressing

## 2024-12-04 NOTE — Group Note (Signed)
 Date:  12/04/2024 Time:  9:05 AM  Group Topic/Focus:  Goals Group:   The focus of this group is to help patients establish daily goals to achieve during treatment and discuss how the patient can incorporate goal setting into their daily lives to aide in recovery.    Participation Level:  Did Not Attend   Amberleigh Gerken A Verdis Koval 12/04/2024, 9:05 AM

## 2024-12-04 NOTE — Group Note (Signed)
 Date:  12/04/2024 Time:  6:02 PM  Group Topic/Focus:  Getting ready for change. Stages of change.    Participation Level:  Did Not Attend  Juliene CHRISTELLA Huddle 12/04/2024, 6:02 PM

## 2024-12-04 NOTE — Group Note (Signed)
 [facility] LCSW Group Therapy Note   Group Date: 12/04/2024 Start Time: 1000 End Time: 1125   Type of Therapy and Topic: Group Therapy: Avoiding Self-Sabotaging and Enabling Behaviors  Participation Level: Minimal  Mood:  Description of Group:  In this group, patients will learn how to identify obstacles, self-sabotaging and enabling behaviors, as well as: what are they, why do we do them and what needs these behaviors meet. Discuss unhealthy relationships and how to have positive healthy boundaries with those that sabotage and enable. Explore aspects of self-sabotage and enabling in yourself and how to limit these self-destructive behaviors in everyday life.   Therapeutic Goals: 1. Patient will identify one obstacle that relates to self-sabotage and enabling behaviors 2. Patient will identify one personal self-sabotaging or enabling behavior they did prior to admission 3. Patient will state a plan to change the above identified behavior 4. Patient will demonstrate ability to communicate their needs through discussion and/or role play.    Summary of Patient Progress:  Pt was present and attended group midway through. Pt was attentive to dialogue and was receptive to information shared.    Therapeutic Modalities:  Cognitive Behavioral Therapy Person-Centered Therapy Motivational Interviewing    Omar Louder, LCSW

## 2024-12-04 NOTE — Group Note (Signed)
 Date:  12/04/2024 Time:  11:32 AM  Group Topic/Focus: Social Work    Pt did not attend social work group.   Brayln Duque R Kilo Eshelman 12/04/2024, 11:32 AM

## 2024-12-04 NOTE — Group Note (Signed)
 Date:  12/04/2024 Time:  10:17 AM  Group Topic/Focus:  Dimensions of Wellness:   The focus of this group is to introduce the topic of wellness and discuss the role each dimension of wellness plays in total health.    Participation Level:  Did Not Attend   Omar Owen A Omar Owen 12/04/2024, 10:17 AM

## 2024-12-04 NOTE — [facility] Group Notes (Signed)
 [facility] Group Notes:  (Nursing/MHT/Case Management/Adjunct)  Date:  12/04/2024  Time:  9:23 PM  Type of Therapy:  Psychoeducational Skills  Participation Level:  Did Not Attend  Participation Quality:  Did Not Attend   Affect:  Did Not Attend   Cognitive:  Did Not Attend   Insight:  None  Engagement in Group:  Did Not Attend   Modes of Intervention:  Education  Summary of Progress/Problems: Patient did not attend group this evening.   Haleemah Buckalew S 12/04/2024, 9:23 PM

## 2024-12-04 NOTE — BH Assessment (Signed)
(  Sleep Hours) - 4.5 (Any PRNs that were needed, meds refused, or side effects to meds)-  (Any disturbances and when (visitation, over night)- None (Concerns raised by the patient)- None (SI/HI/AVH)- Denies

## 2024-12-04 NOTE — Plan of Care (Signed)
   Problem: Education: Goal: Knowledge of [place] [facility] Education information/materials will improve Outcome: Progressing Goal: Emotional status will improve Outcome: Progressing Goal: Mental status will improve Outcome: Progressing

## 2024-12-04 NOTE — Plan of Care (Signed)
  Problem: Activity: Goal: Interest or engagement in activities will improve Outcome: Progressing Goal: Sleeping patterns will improve Outcome: Progressing   Problem: Coping: Goal: Ability to demonstrate self-control will improve Outcome: Progressing   

## 2024-12-05 LAB — BASIC METABOLIC PANEL WITH GFR
Anion gap: 9 (ref 5–15)
BUN: 8 mg/dL (ref 6–20)
CO2: 27 mmol/L (ref 22–32)
Calcium: 9.4 mg/dL (ref 8.9–10.3)
Chloride: 107 mmol/L (ref 98–111)
Creatinine, Ser: 0.87 mg/dL (ref 0.61–1.24)
GFR, Estimated: >60 mL/min (ref >60)
Glucose, Bld: 99 mg/dL (ref 70–99)
Potassium: 4.2 mmol/L (ref 3.5–5.1)
Sodium: 142 mmol/L (ref 135–145)

## 2024-12-05 LAB — HEMOGLOBIN A1C
Hgb A1c MFr Bld: 4.5 % — ABNORMAL LOW (ref 4.8–5.6)
Mean Plasma Glucose: 82.45 mg/dL

## 2024-12-05 LAB — VITAMIN D 25 HYDROXY (VIT D DEFICIENCY, FRACTURES): Vit D, 25-Hydroxy: 15.11 ng/mL — ABNORMAL LOW (ref 30–100)

## 2024-12-05 LAB — VITAMIN B12: Vitamin B-12: 551 pg/mL (ref 180–914)

## 2024-12-05 NOTE — Plan of Care (Signed)
   Problem: Education: Goal: Knowledge of [place] [facility] Education information/materials will improve Outcome: Progressing Goal: Emotional status will improve Outcome: Progressing Goal: Mental status will improve Outcome: Progressing

## 2024-12-05 NOTE — Group Note (Signed)
 Date:  12/05/2024 Time:  9:28 PM  Group Topic/Focus:  Wrap-Up Group:   The focus of this group is to help patients review their daily goal of treatment and discuss progress on daily workbooks.    Participation Level:  Active  Participation Quality:  Appropriate  Affect:  Appropriate  Cognitive:  Appropriate  Insight: Appropriate  Engagement in Group:  Engaged  Modes of Intervention:  Discussion  Additional Comments:  Patient stated that he had a good day.  He rated his day a 9 out 10. Patient stated that he will work on attending more groups  Omar Owen 12/05/2024, 9:28 PM

## 2024-12-05 NOTE — Progress Notes (Signed)
 [facility] MD Progress Note  12/05/2024 10:17 AM Omar Owen  MRN:  [id number]  Principal Problem: Schizophrenia (HCC) Diagnosis: Principal Problem:   Schizophrenia (HCC)   Reason for Admission:  Omar Owen is a 23 yo male w/ hx of MDD, schizophrenia, substance induced psychosis, ADHD admitted under IVC from [facility] due to worsening psychosis in setting of medication nonadherence (admitted on 12/02/2024, total  LOS: 3 days ).   ASSESSMENT: Patient presents flat but slightly less hyper religious on evaluation. He continues to have limited insight, poor judgment, and significant negative symptoms. His symptoms are likely most consistent with schizophrenia given negative UDS and persistent psychotic symptoms. May require a LAI to ensure medication compliance in the future.  PLAN: #Schizophrenia #Hx of MDD #Hx of substance induced psychosis -continue olanzapine  15 mg at bedtime  -hydroxyzine  25 mg tid prn for anxiety -sertraline  50 mg daily -trazodone  50 mg at bedtime prn for insomnia  Disposition Planning: -- Estimated LOS: 5-7 days --Estimated Discharge Date 12/10/24 --Barriers to Discharge: acutely psychotic -- Discharge Goals: Return home with outpatient referrals for mental health follow-up including medication management/psychotherapy  Subjective: The patient was seen and evaluated on the unit. On assessment today the patient reports still feeling like he doesn't need to be hospitalized. He is medication compliant and has been in touch with family. He denies acute somatic complaints from family. He inquired why he would need to attend groups when he had previously been to them but we discussed socialization and learning various coping skills and reluctantly stated he'd be willing to attend some groups.   Objective: Chart Review from last 24 hours:  The patient's chart was reviewed and nursing notes were reviewed. The patient's case was discussed in multidisciplinary team  meeting.  - Overnight events to report per chart review / staff report: none - Patient took all prescribed medications yes - Patient received the following PRNs: nicorette  gum  Current Medications: Current Facility-Administered Medications  Medication Dose Route Frequency Provider Last Rate Last Admin   acetaminophen  (TYLENOL ) tablet 650 mg  650 mg Oral Q6H PRN White, Patrice L, NP       alum & mag hydroxide-simeth (MAALOX/MYLANTA) 200-200-20 MG/5ML suspension 30 mL  30 mL Oral Q4H PRN White, Patrice L, NP       haloperidol  (HALDOL ) tablet 5 mg  5 mg Oral TID PRN White, Patrice L, NP       And   diphenhydrAMINE  (BENADRYL ) capsule 50 mg  50 mg Oral TID PRN White, Patrice L, NP       haloperidol  lactate (HALDOL ) injection 5 mg  5 mg Intramuscular TID PRN White, Patrice L, NP       And   diphenhydrAMINE  (BENADRYL ) injection 50 mg  50 mg Intramuscular TID PRN White, Patrice L, NP       And   LORazepam  (ATIVAN ) injection 2 mg  2 mg Intramuscular TID PRN White, Patrice L, NP       haloperidol  lactate (HALDOL ) injection 10 mg  10 mg Intramuscular TID PRN White, Patrice L, NP       And   diphenhydrAMINE  (BENADRYL ) injection 50 mg  50 mg Intramuscular TID PRN White, Patrice L, NP       And   LORazepam  (ATIVAN ) injection 2 mg  2 mg Intramuscular TID PRN White, Patrice L, NP       magnesium  hydroxide (MILK OF MAGNESIA) suspension 30 mL  30 mL Oral Daily PRN White, Wyline CROME, NP  nicotine  polacrilex (NICORETTE ) gum 2 mg  2 mg Oral PRN Pashayan, Alexander S, DO   2 mg at 12/05/24 9050   OLANZapine  (ZYPREXA ) tablet 15 mg  15 mg Oral QHS White, Patrice L, NP   15 mg at 12/04/24 2126   sertraline  (ZOLOFT ) tablet 50 mg  50 mg Oral Daily White, Patrice L, NP   50 mg at 12/05/24 9050   traZODone  (DESYREL ) tablet 50 mg  50 mg Oral QHS PRN Teresa Wyline CROME, NP        Lab Results:  Results for orders placed or performed during the hospital encounter of 12/02/24 (from the past 48 hours)  Urinalysis,  Complete w Microscopic -Urine, Clean Catch     Status: Abnormal   Collection Time: 12/04/24  6:42 AM  Result Value Ref Range   Color, Urine YELLOW YELLOW   APPearance TURBID (A) CLEAR   Specific Gravity, Urine 1.028 1.005 - 1.030   pH 5.0 5.0 - 8.0   Glucose, UA NEGATIVE NEGATIVE mg/dL   Hgb urine dipstick NEGATIVE NEGATIVE   Bilirubin Urine NEGATIVE NEGATIVE   Ketones, ur NEGATIVE NEGATIVE mg/dL   Protein, ur NEGATIVE NEGATIVE mg/dL   Nitrite NEGATIVE NEGATIVE   Leukocytes,Ua TRACE (A) NEGATIVE   RBC / HPF 0-5 0 - 5 RBC/hpf   WBC, UA 11-20 0 - 5 WBC/hpf   Bacteria, UA MANY (A) NONE SEEN   Squamous Epithelial / HPF 0-5 0 - 5 /HPF   Mucus PRESENT     Comment: Performed at [facility], [address], [place], [place] [address]  Basic metabolic panel with GFR     Status: None   Collection Time: 12/05/24  6:32 AM  Result Value Ref Range   Sodium 142 135 - 145 mmol/L   Potassium 4.2 3.5 - 5.1 mmol/L   Chloride 107 98 - 111 mmol/L   CO2 27 22 - 32 mmol/L   Glucose, Bld 99 70 - 99 mg/dL    Comment: Glucose reference range applies only to samples taken after fasting for at least 8 hours.   BUN 8 6 - 20 mg/dL   Creatinine, Ser 9.12 0.61 - 1.24 mg/dL   Calcium 9.4 8.9 - 89.6 mg/dL   GFR, Estimated >39 >39 mL/min    Comment: (NOTE) Calculated using the CKD-EPI Creatinine Equation (2021)    Anion gap 9 5 - 15    Comment: Performed at [facility], [address] [place], [place] [address]  Vitamin B12     Status: None   Collection Time: 12/05/24  6:32 AM  Result Value Ref Range   Vitamin B-12 551 180 - 914 pg/mL    Comment: Performed at [facility], [address] [place], [place] [address]  Hemoglobin A1c     Status: Abnormal   Collection Time: 12/05/24  6:32 AM  Result Value Ref Range   Hgb A1c MFr Bld 4.5 (L) 4.8 - 5.6 %    Comment: (NOTE) Diagnosis of Diabetes The following HbA1c ranges recommended by the  American Diabetes Association (ADA) may be used as an aid in the diagnosis of diabetes mellitus.  Hemoglobin             Suggested A1C NGSP%              Diagnosis  <5.7                   Non Diabetic  5.7-6.4  Pre-Diabetic  >6.4                   Diabetic  <7.0                   Glycemic control for                       adults with diabetes.     Mean Plasma Glucose 82.45 mg/dL    Comment: Performed at [facility], [address], [place], [place] [address]    Blood Alcohol level:  Lab Results  Component Value Date   [facility] <15 12/01/2024   ETH <15 05/25/2024    Metabolic Labs: Lab Results  Component Value Date   HGBA1C 4.5 (L) 12/05/2024   MPG 82.45 12/05/2024   MPG 97 12/03/2024   No results found for: PROLACTIN Lab Results  Component Value Date   CHOL 139 12/03/2024   TRIG 57 12/03/2024   HDL 42 12/03/2024   CHOLHDL 3.3 12/03/2024   VLDL 11 12/03/2024   LDLCALC 86 12/03/2024   LDLCALC 83 10/29/2023    Physical Findings: AIMS: No  CIWA:    COWS:     Psychiatric Specialty Exam: General Appearance: Appropriate for Environment; Casual   Eye Contact: Fair   Speech: Clear and Coherent   Volume: Normal   Mood: Anxious; Depressed   Affect: Appropriate; Congruent; Blunt   Thought Content: Paranoid Ideation; Illogical (Medication is not working, so I stay away from it.)   Suicidal Thoughts: denies   Homicidal Thoughts: denies   Thought Process: Linear   Orientation: Full (Time, Place and Person)     Memory: Immediate Fair; Recent Fair   Judgment: Poor   Insight: Poor   Concentration: Fair   Recall: Poor   Fund of Knowledge: Fair   Language: Fair   Psychomotor Activity: fair   Assets: Manufacturing Systems Engineer; Physical Health; Resilience   Sleep: No data recorded    Review of Systems ROS  Vital Signs: Blood pressure 111/87, pulse 65, temperature (!) 97.4 F (36.3 C), temperature source Oral, resp. rate 16,  height 6' (1.829 m), weight 61.7 kg, SpO2 98%. Body mass index is 18.44 kg/m. Physical Exam  I certify that inpatient services furnished can reasonably be expected to improve the patient's condition.   Signed: Prentice Espy, MD 12/05/2024, 10:17 AM

## 2024-12-05 NOTE — Plan of Care (Signed)
   Problem: Education: Goal: Emotional status will improve Outcome: Progressing Goal: Mental status will improve Outcome: Progressing Goal: Verbalization of understanding the information provided will improve Outcome: Progressing

## 2024-12-05 NOTE — Progress Notes (Signed)
(  Sleep Hours) -[id number] (Any PRNs that were needed, meds refused, or side effects to meds)- none (Any disturbances and when (visitation, over night)-none (Concerns raised by the patient)- none (SI/HI/AVH)- denies all

## 2024-12-05 NOTE — Group Note (Signed)
 Date:  12/05/2024 Time:  6:45 PM  Group Topic/Focus:  Making Healthy Choices:   The focus of this group is to help patients identify negative/unhealthy choices they were using prior to admission and identify positive/healthier coping strategies to replace them upon discharge. Addiction, [place] experiment and natural ways to get dopamine boosts.    Participation Level:  Did Not Attend   Juliene CHRISTELLA Huddle 12/05/2024, 6:45 PM

## 2024-12-05 NOTE — Group Note (Signed)
 Date:  12/05/2024 Time:  3:38 PM  Group Topic/Focus:   Physical Wellness:   The focus of this group is to introduce Progressive Muscle Relaxation as a technique to reduce tension in the body and relieve stress and anger.   Participation Level:  Did Not Attend   Omar Owen Mars 12/05/2024, 3:38 PM

## 2024-12-05 NOTE — Group Note (Signed)
 Date:  12/05/2024 Time:  3:27 PM  Group Topic/Focus:  Goals Group:   The focus of this group is to help patients establish daily goals to achieve during treatment and discuss how the patient can incorporate goal setting into their daily lives to aide in recovery. Orientation:   The focus of this group is to educate the patient on the purpose and policies of crisis stabilization and provide a format to answer questions about their admission.  The group details unit policies and expectations of patients while admitted.    Participation Level:  Did Not Attend   Omar Owen 12/05/2024, 3:27 PM

## 2024-12-05 NOTE — Group Note (Signed)
 Date:  12/05/2024 Time:  3:33 PM  Group Topic/Focus:   Emotional Wellness:   The focus of this group is to discuss boundaries, barriers to setting them, as well as healthy ways to communicate boundaries.    Participation Level:  Did Not Attend   Omar Owen 12/05/2024, 3:33 PM

## 2024-12-06 ENCOUNTER — Other Ambulatory Visit (HOSPITAL_COMMUNITY): Payer: Self-pay

## 2024-12-06 ENCOUNTER — Telehealth (HOSPITAL_COMMUNITY): Payer: Self-pay

## 2024-12-06 MED ORDER — OLANZAPINE 5 MG PO TABS
5.0000 mg | ORAL_TABLET | Freq: Every day | ORAL | Status: AC
  Administered 2024-12-06: 5 mg via ORAL
  Filled 2024-12-06: qty 1

## 2024-12-06 MED ORDER — ARIPIPRAZOLE 15 MG PO TABS
15.0000 mg | ORAL_TABLET | Freq: Every day | ORAL | Status: DC
  Administered 2024-12-07 – 2024-12-09 (×3): 15 mg via ORAL
  Filled 2024-12-06 (×2): qty 1
  Filled 2024-12-06: qty 10
  Filled 2024-12-06: qty 1

## 2024-12-06 MED ORDER — VITAMIN D (ERGOCALCIFEROL) 1.25 MG (50000 UNIT) PO CAPS
50000.0000 [IU] | ORAL_CAPSULE | ORAL | Status: DC
  Administered 2024-12-06: 50000 [IU] via ORAL
  Filled 2024-12-06: qty 1

## 2024-12-06 MED ORDER — ARIPIPRAZOLE ER 400 MG IM SRER
400.0000 mg | INTRAMUSCULAR | Status: DC
  Filled 2024-12-06: qty 2

## 2024-12-06 MED ORDER — ARIPIPRAZOLE 10 MG PO TABS
10.0000 mg | ORAL_TABLET | Freq: Once | ORAL | Status: AC
  Administered 2024-12-06: 10 mg via ORAL
  Filled 2024-12-06: qty 1

## 2024-12-06 NOTE — Group Note (Addendum)
 Recreation Therapy Group Note   Group Topic:Stress Management  Group Date: 12/06/2024 Start Time: 0940 End Time: 1002 Facilitators: Sade Hollon-McCall, LRT,CTRS Location: 300 Hall Dayroom   Group Topic: Stress Management   Goal Area(s) Addresses:  Patient will actively participate in stress management techniques presented during session.  Patient will successfully identify benefit of practicing stress management post d/c.   Behavioral Response:   Intervention: Relaxation exercise with ambient sound and script   Activity: Guided Imagery. LRT provided education, instruction, and demonstration on practice of visualization via guided imagery. Patient was asked to participate in the technique introduced during session. LRT debriefed including topics of mindfulness, stress management and specific scenarios each patient could use these techniques. Patients were given suggestions of ways to access scripts post d/c and encouraged to explore Youtube and other apps available on smartphones, tablets, and computers.  Education:  Stress Management, Discharge Planning.   Education Outcome: Acknowledges education   Affect/Mood: N/A   Participation Level: Did not attend    Clinical Observations/Individualized Feedback:      Plan: Continue to engage patient in RT group sessions 2-3x/week.   Missey Hasley-McCall, LRT,CTRS 12/06/2024 11:48 AM

## 2024-12-06 NOTE — Plan of Care (Signed)
 Nurse discussed coping skills with patient.

## 2024-12-06 NOTE — Progress Notes (Signed)
 D:  Patient denied SI and HI, contracts for safety.  Denied A/V hallucinations.  Denied pain. A:  Medications administered per MD orders.  Emotional support and encouragement given patient. R:  Safety maintained with 15 minute checks.

## 2024-12-06 NOTE — Group Note (Signed)
 Date:  12/06/2024 Time:  2:04 PM  Group Topic/Focus: [place]in Group  Spirituality:   The focus of this group is to discuss how one's spirituality can aide in recovery.    Participation Level:  Did Not Attend   [name] 12/06/2024, 2:04 PM

## 2024-12-06 NOTE — Progress Notes (Signed)
(  Sleep Hours) -8.25 as of 0530 (Any PRNs that were needed, meds refused, or side effects to meds)- none (Any disturbances and when (visitation, over night)-none (Concerns raised by the patient)- none (SI/HI/AVH)- denies all

## 2024-12-06 NOTE — [facility] Group Notes (Signed)
 The focus of this group is to help patients review their daily goal of treatment and discuss progress on daily workbooks. Pt was attentive and appropriate during tonight's wrap up group discussion with aa.

## 2024-12-06 NOTE — Group Note (Signed)
 Date:  12/06/2024 Time:  11:32 AM  Group Topic/Focus: Emotional wellness Emotional Education:   The focus of this group is to discuss what feelings/emotions are, and how they are experienced.    Participation Level:  Did Not Attend   Omar Owen 12/06/2024, 11:32 AM

## 2024-12-06 NOTE — Progress Notes (Signed)
 [facility] MD Progress Note  12/06/2024 7:22 AM Omar Owen  MRN:  [id number]  Principal Problem: Schizophrenia (HCC) Diagnosis: Principal Problem:   Schizophrenia (HCC)   Reason for Admission:  Omar Owen is a 23 yo male w/ hx of MDD, schizophrenia, substance induced psychosis, ADHD admitted under IVC from [facility] due to worsening psychosis in setting of medication nonadherence (admitted on 12/02/2024, total  LOS: 4 days ).   ASSESSMENT: Omar Owen remains compliant with his medication regimen. Recent labs were reviewed: A1c is slightly low at 4.5, and vitamin D  is low at 15.11; vitamin D  50,000 units weekly will be initiated. BMP is unremarkable. Insight and judgment show some improvement. He consents to the LAI to support future medication adherence. The case was reviewed with the attending psychiatrist, and the plan is to cross-taper from Zyprexa  to Abilify . Abilify  Omar Owen has been scheduled for administration on Wednesday, with anticipated discharge on Thursday, December 11, pending continued stability.    PLAN: #Schizophrenia #Hx of MDD #Hx of substance induced psychosis - Start Abilify  10 mg oral x 1 immediate dose - Start Abilify  15 mg oral daily starting 12/07/24 - Start Abilify  Maintena injection 400 mg Q 28 days, 12/08/24 - Decrease olanzapine  to 5 mg at bedtime x 1 dose then stop -hydroxyzine  25 mg tid prn for anxiety - sertraline  50 mg daily -trazodone  50 mg at bedtime prn for insomnia  Disposition Planning: -- Estimated LOS: 5-7 days --Estimated Discharge Date 12/09/24 --Barriers to Discharge: acutely psychotic -- Discharge Goals: Return home with outpatient referrals for mental health follow-up including medication management/psychotherapy  Subjective: Omar Owen was seen in his room during rounds and reports his mood as "okay." He denies suicidal ideation, intent, or plan. He reports no issues with sleep and states his appetite is adequate. He denies auditory or  visual hallucinations and denies paranoia. He also denies any side effects from his current psychiatric medications. Regarding his understanding of the hospitalization, he states, "My parents wanted to check on me to make sure I'm OK." He appears preoccupied with discharge. We discussed in detail the benefits of a long-acting injectable antipsychotic, and he provided consent for this treatment option.   Objective: Chart Review from last 24 hours:  The patient's chart was reviewed and nursing notes were reviewed. The patient's case was discussed in multidisciplinary team meeting.  - Overnight events to report per chart review / staff report: none - Patient took all prescribed medications yes - Patient received the following PRNs: nicotine  gum  Current Medications: Current Facility-Administered Medications  Medication Dose Route Frequency Provider Last Rate Last Admin   acetaminophen  (TYLENOL ) tablet 650 mg  650 mg Oral Q6H PRN White, Patrice L, NP       alum & mag hydroxide-simeth (MAALOX/MYLANTA) 200-200-20 MG/5ML suspension 30 mL  30 mL Oral Q4H PRN White, Patrice L, NP       haloperidol  (HALDOL ) tablet 5 mg  5 mg Oral TID PRN White, Patrice L, NP       And   diphenhydrAMINE  (BENADRYL ) capsule 50 mg  50 mg Oral TID PRN White, Patrice L, NP       haloperidol  lactate (HALDOL ) injection 5 mg  5 mg Intramuscular TID PRN White, Patrice L, NP       And   diphenhydrAMINE  (BENADRYL ) injection 50 mg  50 mg Intramuscular TID PRN White, Patrice L, NP       And   LORazepam  (ATIVAN ) injection 2 mg  2 mg Intramuscular TID PRN White,  Patrice L, NP       haloperidol  lactate (HALDOL ) injection 10 mg  10 mg Intramuscular TID PRN White, Patrice L, NP       And   diphenhydrAMINE  (BENADRYL ) injection 50 mg  50 mg Intramuscular TID PRN White, Patrice L, NP       And   LORazepam  (ATIVAN ) injection 2 mg  2 mg Intramuscular TID PRN White, Patrice L, NP       magnesium  hydroxide (MILK OF MAGNESIA) suspension 30  mL  30 mL Oral Daily PRN White, Patrice L, NP       nicotine  polacrilex (NICORETTE ) gum 2 mg  2 mg Oral PRN Pashayan, Alexander S, DO   2 mg at 12/05/24 2057   OLANZapine  (ZYPREXA ) tablet 15 mg  15 mg Oral QHS White, Patrice L, NP   15 mg at 12/05/24 2056   sertraline  (ZOLOFT ) tablet 50 mg  50 mg Oral Daily White, Patrice L, NP   50 mg at 12/05/24 0949   traZODone  (DESYREL ) tablet 50 mg  50 mg Oral QHS PRN White, Wyline CROME, NP        Lab Results:  Results for orders placed or performed during the hospital encounter of 12/02/24 (from the past 48 hours)  Basic metabolic panel with GFR     Status: None   Collection Time: 12/05/24  6:32 AM  Result Value Ref Range   Sodium 142 135 - 145 mmol/L   Potassium 4.2 3.5 - 5.1 mmol/L   Chloride 107 98 - 111 mmol/L   CO2 27 22 - 32 mmol/L   Glucose, Bld 99 70 - 99 mg/dL    Comment: Glucose reference range applies only to samples taken after fasting for at least 8 hours.   BUN 8 6 - 20 mg/dL   Creatinine, Ser 9.12 0.61 - 1.24 mg/dL   Calcium 9.4 8.9 - 89.6 mg/dL   GFR, Estimated >39 >39 mL/min    Comment: (NOTE) Calculated using the CKD-EPI Creatinine Equation (2021)    Anion gap 9 5 - 15    Comment: Performed at [facility], [address] [place], [place] [address]  VITAMIN D  25 Hydroxy (Vit-D Deficiency, Fractures)     Status: Abnormal   Collection Time: 12/05/24  6:32 AM  Result Value Ref Range   Vit D, 25-Hydroxy 15.11 (L) 30 - 100 ng/mL    Comment: (NOTE) Vitamin D  deficiency has been defined by the Institute of Medicine  and an Endocrine Society practice guideline as a level of serum 25-OH  vitamin D  less than 20 ng/mL (1,2). The Endocrine Society went on to  further define vitamin D  insufficiency as a level between 21 and 29  ng/mL (2).  1. IOM (Institute of Medicine). 2010. Dietary reference intakes for  calcium and D. [place]: [employer]. 2. Holick MF, [place], Bischoff-Ferrari HA, et  al. Evaluation,  treatment, and prevention of vitamin D  deficiency: an Endocrine  Society clinical practice guideline, JCEM. 2011 Jul; 96(7): 1911-30.  Performed at [facility], [address], [place], [place] [address]   Vitamin B12     Status: None   Collection Time: 12/05/24  6:32 AM  Result Value Ref Range   Vitamin B-12 551 180 - 914 pg/mL    Comment: Performed at [facility], [address], [place], [place] [address]  Hemoglobin A1c     Status: Abnormal   Collection Time: 12/05/24  6:32 AM  Result Value Ref Range   Hgb A1c MFr Bld 4.5 (L) 4.8 - 5.6 %    Comment: (NOTE) Diagnosis of Diabetes The following HbA1c ranges recommended by the American Diabetes Association (ADA) may be used as an aid in the diagnosis of diabetes mellitus.  Hemoglobin             Suggested A1C NGSP%              Diagnosis  <5.7                   Non Diabetic  5.7-6.4                Pre-Diabetic  >6.4                   Diabetic  <7.0                   Glycemic control for                       adults with diabetes.     Mean Plasma Glucose 82.45 mg/dL    Comment: Performed at [facility], [address], [place], [place] [address]    Blood Alcohol level:  Lab Results  Component Value Date   [facility] <15 12/01/2024   ETH <15 05/25/2024    Metabolic Labs: Lab Results  Component Value Date   HGBA1C 4.5 (L) 12/05/2024   MPG 82.45 12/05/2024   MPG 97 12/03/2024   No results found for: PROLACTIN Lab Results  Component Value Date   CHOL 139 12/03/2024   TRIG 57 12/03/2024   HDL 42 12/03/2024   CHOLHDL 3.3 12/03/2024   VLDL 11 12/03/2024   LDLCALC 86 12/03/2024   LDLCALC 83 10/29/2023    Physical Findings: AIMS: No  CIWA:    COWS:     Psychiatric Specialty Exam: General Appearance: Appropriate for Environment; Casual   Eye Contact: Fair   Speech: Clear and Coherent   Volume: Normal   Mood: Anxious; Depressed   Affect: Appropriate;  Congruent; Blunt   Thought Content: Paranoid Ideation; Illogical (Medication is not working, so I stay away from it.)   Suicidal Thoughts: Denies   Homicidal Thoughts: Denies   Thought Process: Linear   Orientation: Full (Time, Place and Person)     Memory: Immediate Fair; Recent Fair   Judgment: Poor   Insight: Poor   Concentration: Fair   Recall: Poor   Fund of Knowledge: Fair   Language: Fair   Psychomotor Activity: fair   Assets: Manufacturing Systems Engineer; Physical Health; Resilience   Sleep: No data recorded    Review of Systems ROS  Vital Signs: Blood pressure 116/82, pulse 65, temperature (!) 97.5 F (36.4 C), temperature source Oral, resp. rate 18, height 6' (1.829 m), weight 61.7 kg, SpO2 97%. Body mass index is 18.44 kg/m. Physical Exam  I certify that inpatient services furnished can reasonably be expected to improve the patient's condition.   Signed: Blair Chiquita Hint, NP 12/06/2024, 7:22 AMPatient ID: Beverley Pepper, male   DOB: 2001-04-06, 23 y.o.   MRN: [id number]

## 2024-12-06 NOTE — Progress Notes (Signed)
 Nurse discussed anxiety, depression and coping skills with patient.

## 2024-12-06 NOTE — Group Note (Signed)
 Date:  12/06/2024 Time:  1:11 PM  Group Topic/Focus: Physical wellness Regular exercise may improve depression or anxiety symptoms enough to make a big difference. That big difference can help kick-start further improvements. The mental health benefits of exercise and physical activity may last only if you stick with them over the long term.      Participation Level:  Did Not Attend  Omar Owen 12/06/2024, 1:11 PM

## 2024-12-06 NOTE — Group Note (Signed)
 Date:  12/06/2024 Time:  10:16 AM  Group Topic/Focus: orientation and goals group Goals Group:   The focus of this group is to help patients establish daily goals to achieve during treatment and discuss how the patient can incorporate goal setting into their daily lives to aide in recovery. Orientation:   The focus of this group is to educate the patient on the purpose and policies of crisis stabilization and provide a format to answer questions about their admission.  The group details unit policies and expectations of patients while admitted.    Participation Level:  Active  Participation Quality:  Appropriate  Affect:  Appropriate  Cognitive:  Alert  Insight: Appropriate  Engagement in Group:  Engaged  Modes of Intervention:  Orientation  Additional Comments:  Patient attended   [employer] 12/06/2024, 10:16 AM

## 2024-12-06 NOTE — Group Note (Signed)
 Date:  12/06/2024 Time:  10:58 AM  Group Topic/Focus- Recreational Therapy  Meditation as part of recreational therapy can be highly beneficial for mental health patients. It offers a wide range of therapeutic benefits, including stress reduction, emotional regulation, and enhanced self-awareness. When integrated into a mental health treatment plan, meditation can serve as a useful tool to help patients manage anxiety, depression, and other mood disorders.       Participation Level:  Did Not Attend   Omar Owen 12/06/2024, 10:58 AM

## 2024-12-06 NOTE — Group Note (Signed)
 Date:  12/06/2024 Time:  4:10 PM  Group Topic/Focus:OT therapy   Talking to mental health students about sleep hygiene strategies in an occupational therapy (OT) setting is a great way to help them develop healthy routines that support their mental and emotional well-being. Sleep hygiene is crucial for managing stress, reducing anxiety, and enhancing overall mood and cognitive function.    Participation Level:  Did Not Attend   [name] 12/06/2024, 4:10 PM

## 2024-12-06 NOTE — Telephone Encounter (Signed)
 Pharmacy Patient Advocate Encounter  Insurance verification completed.    The patient is insured through [facility]. Patient has [employer], may use a copay card, and/or apply for patient assistance if available.    Ran test claim for Abilify  Maintena 400mg  Syringe and the current 30 day co-pay is $45.   This test claim was processed through [employer]- copay amounts may vary at other pharmacies due to [employer], or as the patient moves through the different stages of their insurance plan.

## 2024-12-07 MED ORDER — IBUPROFEN 600 MG PO TABS
600.0000 mg | ORAL_TABLET | Freq: Four times a day (QID) | ORAL | Status: DC | PRN
  Administered 2024-12-07: 600 mg via ORAL
  Filled 2024-12-07: qty 1

## 2024-12-07 NOTE — Group Note (Signed)
 Date:  12/07/2024 Time:  9:43 AM  Group Topic/Focus: Coping skills orientation group assessment Coping With Mental Health Crisis:   The purpose of this group is to help patients identify strategies for coping with mental health crisis.  Group discusses possible causes of crisis and ways to manage them effectively. We also created a fun way to practice managing stress, emotions, and challenges! You could approach this in different ways depending on whether you're playing alone, with others, or looking to use it as a tool for therapy or personal growth.    Participation Level:  Active  Participation Quality:  Appropriate  Affect:  Appropriate  Cognitive:  Alert and Appropriate  Insight: Appropriate and Good  Engagement in Group:  Engaged  Modes of Intervention:  Education and Orientation  Additional Comments:  Patient attend  Omar Owen 12/07/2024, 9:43 AM

## 2024-12-07 NOTE — Progress Notes (Signed)
 [facility] MD Progress Note  12/07/2024 6:46 AM Omar Owen  MRN:  [id number]  Principal Problem: Schizophrenia (HCC) Diagnosis: Principal Problem:   Schizophrenia (HCC)   Reason for Admission:  Omar Owen is a 23 yo male w/ hx of MDD, schizophrenia, substance induced psychosis, ADHD admitted under IVC from [facility] due to worsening psychosis in setting of medication nonadherence (admitted on 12/02/2024, total  LOS: 5 days ).   ASSESSMENT: Omar Owen remains compliant with his medication regimen. Recent labs were reviewed: A1c is slightly low at 4.5, and vitamin D  is low at 15.11; vitamin D  50,000 units weekly will be initiated. BMP is unremarkable. Insight and judgment show some improvement. He consents to the LAI to support future medication adherence. The case was reviewed with the attending psychiatrist, and the plan is to cross-taper from Zyprexa  to Abilify . Abilify  [name] has been scheduled for administration on Wednesday, with anticipated discharge on Thursday, December 11, pending continued stability.    PLAN: #Schizophrenia #Hx of MDD #Hx of substance induced psychosis - Start Abilify  10 mg oral x 1 immediate dose - Start Abilify  15 mg oral daily starting 12/07/24 - Start Abilify  Maintena injection 400 mg Q 28 days, 12/08/24 - Decrease olanzapine  to 5 mg at bedtime x 1 dose then stop -hydroxyzine  25 mg tid prn for anxiety - sertraline  50 mg daily -trazodone  50 mg at bedtime prn for insomnia  Disposition Planning: -- Estimated LOS: 5-7 days --Estimated Discharge Date 12/09/24 --Barriers to Discharge: acutely psychotic -- Discharge Goals: Return home with outpatient referrals for mental health follow-up including medication management/psychotherapy  Subjective: Omar Owen was seen in his room during rounds and reports his mood as "okay." He denies suicidal ideation, intent, or plan. He reports no issues with sleep and states his appetite is adequate. He denies auditory or  visual hallucinations and denies paranoia. He also denies any side effects from his current psychiatric medications. Regarding his understanding of the hospitalization, he states, "My parents wanted to check on me to make sure I'm OK." He appears preoccupied with discharge. We discussed in detail the benefits of a long-acting injectable antipsychotic, and he provided consent for this treatment option.   Objective: Chart Review from last 24 hours:  The patient's chart was reviewed and nursing notes were reviewed. The patient's case was discussed in multidisciplinary team meeting.  - Overnight events to report per chart review / staff report: none - Patient took all prescribed medications yes - Patient received the following PRNs: nicotine  gum  Current Medications: Current Facility-Administered Medications  Medication Dose Route Frequency Provider Last Rate Last Admin   acetaminophen  (TYLENOL ) tablet 650 mg  650 mg Oral Q6H PRN White, Patrice L, NP       alum & mag hydroxide-simeth (MAALOX/MYLANTA) 200-200-20 MG/5ML suspension 30 mL  30 mL Oral Q4H PRN White, Patrice L, NP       ARIPiprazole  (ABILIFY ) tablet 15 mg  15 mg Oral Daily [name] H, NP       [START ON 12/08/2024] ARIPiprazole  ER (ABILIFY  MAINTENA) injection 400 mg  400 mg Intramuscular Q28 days Quinci Gavidia H, NP       haloperidol  (HALDOL ) tablet 5 mg  5 mg Oral TID PRN White, Patrice L, NP       And   diphenhydrAMINE  (BENADRYL ) capsule 50 mg  50 mg Oral TID PRN White, Patrice L, NP       haloperidol  lactate (HALDOL ) injection 5 mg  5 mg Intramuscular TID PRN White, Patrice L,  NP       And   diphenhydrAMINE  (BENADRYL ) injection 50 mg  50 mg Intramuscular TID PRN White, Patrice L, NP       And   LORazepam  (ATIVAN ) injection 2 mg  2 mg Intramuscular TID PRN White, Patrice L, NP       haloperidol  lactate (HALDOL ) injection 10 mg  10 mg Intramuscular TID PRN White, Patrice L, NP       And   diphenhydrAMINE  (BENADRYL )  injection 50 mg  50 mg Intramuscular TID PRN White, Patrice L, NP       And   LORazepam  (ATIVAN ) injection 2 mg  2 mg Intramuscular TID PRN White, Patrice L, NP       magnesium  hydroxide (MILK OF MAGNESIA) suspension 30 mL  30 mL Oral Daily PRN White, Patrice L, NP       nicotine  polacrilex (NICORETTE ) gum 2 mg  2 mg Oral PRN Pashayan, Alexander S, DO   2 mg at 12/06/24 1938   sertraline  (ZOLOFT ) tablet 50 mg  50 mg Oral Daily White, Patrice L, NP   50 mg at 12/06/24 9177   traZODone  (DESYREL ) tablet 50 mg  50 mg Oral QHS PRN White, Patrice L, NP       Vitamin D  (Ergocalciferol ) (DRISDOL ) 1.25 MG (50000 UNIT) capsule 50,000 Units  50,000 Units Oral Q7 days Blair, Arlyce Circle H, NP   50,000 Units at 12/06/24 1031    Lab Results:  No results found for this or any previous visit (from the past 48 hours).   Blood Alcohol level:  Lab Results  Component Value Date   [facility] <15 12/01/2024   ETH <15 05/25/2024    Metabolic Labs: Lab Results  Component Value Date   HGBA1C 4.5 (L) 12/05/2024   MPG 82.45 12/05/2024   MPG 97 12/03/2024   No results found for: PROLACTIN Lab Results  Component Value Date   CHOL 139 12/03/2024   TRIG 57 12/03/2024   HDL 42 12/03/2024   CHOLHDL 3.3 12/03/2024   VLDL 11 12/03/2024   LDLCALC 86 12/03/2024   LDLCALC 83 10/29/2023    Physical Findings: AIMS: No  CIWA:    COWS:     Psychiatric Specialty Exam: General Appearance: Appropriate for Environment; Casual   Eye Contact: Fair   Speech: Clear and Coherent   Volume: Normal   Mood: Anxious; Depressed   Affect: Appropriate; Congruent; Blunt   Thought Content: Paranoid Ideation; Illogical (Medication is not working, so I stay away from it.)   Suicidal Thoughts: Denies   Homicidal Thoughts: Denies   Thought Process: Linear   Orientation: Full (Time, Place and Person)     Memory: Immediate Fair; Recent Fair   Judgment: Poor   Insight: Poor   Concentration: Fair   Recall: Poor    Fund of Knowledge: Fair   Language: Fair   Psychomotor Activity: fair   Assets: Manufacturing Systems Engineer; Physical Health; Resilience   Sleep: No data recorded    Review of Systems ROS  Vital Signs: Blood pressure 127/75, pulse 72, temperature (!) 97.5 F (36.4 C), temperature source Oral, resp. rate 18, height 6' (1.829 m), weight 61.7 kg, SpO2 99%. Body mass index is 18.44 kg/m. Physical Exam  I certify that inpatient services furnished can reasonably be expected to improve the patient's condition.   Signed: Blair Chiquita Hint, NP 12/07/2024, 6:46 AMPatient ID: Beverley Pepper, male   DOB: 2001/07/08, 23 y.o.   MRN: [id number] Patient ID: Marquavis Hannen, male  DOB: 03-09-2001, 23 y.o.   MRN: [id number]

## 2024-12-07 NOTE — Group Note (Signed)
 Date:  12/07/2024 Time:  8:35 PM  Group Topic/Focus:  Wrap-Up Group:   The focus of this group is to help patients review their daily goal of treatment and discuss progress on daily workbooks.    Participation Level:  Active  Participation Quality:  Appropriate  Affect:  Appropriate  Cognitive:  Appropriate  Insight: Appropriate  Engagement in Group:  Engaged  Modes of Intervention:  Discussion  Additional Comments:   Pt was very focused on eating during group discussion. Pt states that he's been participating in programming and recreational activities. Pt d/c plan is to take his meds and be good.   Bonnell Placzek A Sherrel Ploch 12/07/2024, 8:35 PM

## 2024-12-07 NOTE — Group Note (Signed)
 Recreation Therapy Group Note   Group Topic:Animal Assisted Therapy   Group Date: 12/07/2024 Start Time: 0945 End Time: 1030 Facilitators: Diamonte Stavely-McCall, LRT,CTRS Location: [address] Dayroom   Animal-Assisted Activity (AAA) Program Checklist/Progress Notes Patient Eligibility Criteria Checklist & Daily Group note for Rec Tx Intervention  AAA/T Program Assumption of Risk Form signed by Patient/ or Parent Legal Guardian Yes  Patient understands his/her participation is voluntary Yes  Behavioral Response:    Education: Charity Fundraiser, Appropriate Animal Interaction   Education Outcome: Acknowledges education.    Affect/Mood: N/A   Participation Level: Did not attend    Clinical Observations/Individualized Feedback:      Plan: Continue to engage patient in RT group sessions 2-3x/week.   Miliana Gangwer-McCall, LRT,CTRS 12/07/2024 12:49 PM

## 2024-12-07 NOTE — Group Note (Signed)
 Date:  12/07/2024 Time:  10:03 AM  Group Topic/Focus: Pet therapy Pet therapy, or animal-assisted therapy, has become an increasingly popular and effective method for improving mental health in adults. The presence of animals can provide emotional support, reduce stress, and enhance overall well-being.    Participation Level:  Did Not Attend  Omar Owen 12/07/2024, 10:03 AM

## 2024-12-07 NOTE — Group Note (Signed)
 Date:  12/07/2024 Time:  11:49 AM  Group Topic/Focus: Social work group Social work financial risk analyst in set designer promotes well-being through by assessment, diagnosis, treatment, and prevention of mental illness, substance use.    Participation Level:  Active   Dolores HERO Jams Trickett 12/07/2024, 11:49 AM

## 2024-12-07 NOTE — Progress Notes (Signed)
(  Sleep Hours) -7  (Any PRNs that were needed, meds refused, or side effects to meds)- Trazodone  50 mg  (Any disturbances and when (visitation, over night)-none  (Concerns raised by the patient)- wants to discuss meds with MD  (SI/HI/AVH)- denies

## 2024-12-07 NOTE — Group Note (Addendum)
 LCSW Group Therapy Note   Group Date: 12/07/2024 Start Time: 1100 End Time: 1200   Participation:  did not attend  Type of Therapy:  Group Therapy  Topic: Finding Balance: Using Wise Mind for Thoughtful Decisions  Objective:  To help participants understand and apply the concept of Delsie Mind to make balanced, thoughtful decisions by integrating emotion and logic.  Goals: Learn the differences between Emotional Mind, Reasonable Mind, and [employer]. Recognize personal signs of Emotional and Reasonable Mind. Practice using [employer] in real-life scenarios.  Therapeutic Modalities: - Elements of Dialectical Behavior Therapy (DBT):  Mindfulness (noticing thoughts and emotions without judgment), Emotion Regulation (understanding and managing emotional responses), Distress Tolerance (coping with difficult situations without making them worse), Wise Mind (integrating emotion and reason for balanced decision-making) - Elements of Cognitive Behavioral Therapy (CBT):  Identifying automatic thoughts, Challenging cognitive distortions, Using logic to reframe unhelpful thinking patterns  Summary:  This class focused on [employer], DBT's concept of balancing Emotional Mind and Reasonable Mind. We identified when we're in each state and practiced using Wise Mind to respond thoughtfully in real-life situations. By combining emotion and logic, participants can improve decision-making, manage challenges, and enhance relationships.   Aminta Sakurai O Mackensie Pilson, LCSWA 12/07/2024  12:53 PM

## 2024-12-07 NOTE — Plan of Care (Signed)
   Problem: Education: Goal: Emotional status will improve Outcome: Progressing Goal: Mental status will improve Outcome: Progressing Goal: Verbalization of understanding the information provided will improve Outcome: Progressing

## 2024-12-07 NOTE — Group Note (Signed)
 Date:  12/07/2024 Time:  5:33 PM  Group Topic/Focus:  Emotional Education:   The focus of this group is to discuss what feelings/emotions are, and how they are experienced. Overcoming Stress:   The focus of this group is to define stress and help patients assess their triggers.    Participation Level:  Did Not Attend  Participation Quality:    Affect:    Cognitive:    Insight:   Engagement in Group:    Modes of Intervention:    Additional Comments:    Asberry LITTIE Givens 12/07/2024, 5:33 PM

## 2024-12-07 NOTE — [facility] Suicide Risk Assessment (Signed)
 [facility] INPATIENT:  Family/Significant Other Suicide Prevention Education  Suicide Prevention Education:  Education Completed; Mother, Eligio Angert [phone] ,  (name of family member/significant other) has been identified by the patient as the family member/significant other with whom the patient will be residing, and identified as the person(s) who will aid the patient in the event of a mental health crisis (suicidal ideations/suicide attempt).  With written consent from the patient, the family member/significant other has been provided the following suicide prevention education, prior to the and/or following the discharge of the patient.  The suicide prevention education provided includes the following: Suicide risk factors Suicide prevention and interventions National Suicide Hotline telephone number [facility] assessment telephone number [facility] Emergency Assistance 911 [facility] and/or Residential Mobile Crisis Unit telephone number  Request made of family/significant other to: Remove weapons (e.g., guns, rifles, knives), all items previously/currently identified as safety concern.   Remove drugs/medications (over-the-counter, prescriptions, illicit drugs), all items previously/currently identified as a safety concern.  The family member/significant other verbalizes understanding of the suicide prevention education information provided.  The family member/significant other agrees to remove the items of safety concern listed above.  Jakyren Fluegge LCSWA 12/07/2024, 1:25 PM

## 2024-12-07 NOTE — Plan of Care (Signed)

## 2024-12-07 NOTE — Progress Notes (Signed)
 D: Patient is alert, oriented, and cooperative. Denies SI, HI, AVH, and verbally contracts for safety. Patient reports he slept good last night without sleeping medication. Patient reports his appetite as good, energy level as normal, and concentration as good. Patient rates his depression 0/10, hopelessness 0/10, and anxiety 0/10.    A: Scheduled medications administered per MD order. PRN ibuprofen  and nicotine  gum administered. Support provided. Patient educated on safety on the unit and medications. Routine safety checks every 15 minutes. Patient stated understanding to tell nurse about any new physical symptoms. Patient understands to tell staff of any needs.     R: No adverse drug reactions noted. Patient remains safe at this time and will continue to monitor.    12/07/24 1000  Psych Admission Type (Psych Patients Only)  Admission Status Involuntary  Psychosocial Assessment  Patient Complaints None  Eye Contact Fair  Facial Expression Anxious  Affect Appropriate to circumstance  Speech Logical/coherent  Interaction Minimal  Motor Activity Other (Comment) (WNL)  Appearance/Hygiene Unremarkable  Behavior Characteristics Cooperative  Mood Anxious  Thought Process  Coherency WDL  Content WDL  Delusions None reported or observed  Perception WDL  Hallucination None reported or observed  Judgment Poor  Confusion None  Danger to Self  Current suicidal ideation? Denies  Agreement Not to Harm Self Yes  Description of Agreement verbal  Danger to Others  Danger to Others None reported or observed

## 2024-12-07 NOTE — Group Note (Signed)
 Date:  12/07/2024 Time:  1:06 PM  Group Topic/Focus: [facility] Coping With Mental Health Crisis:   The purpose of this group is to help patients identify strategies for coping with mental health crisis.  Group discusses possible causes of crisis and ways to manage them effectively. Crisis Planning:   The purpose of this group is to help patients create a crisis plan for use upon discharge or in the future, as needed.    Participation Level:  Did Not Attend   [name] 12/07/2024, 1:06 PM

## 2024-12-07 NOTE — Progress Notes (Signed)
 Pt wants to get back on Promethazine states  he took it as a child ans he helped him feel less anxious and calmer around people. Writer told him to follow up with doctor and discuss medication request with treatment team.

## 2024-12-08 ENCOUNTER — Encounter (HOSPITAL_COMMUNITY): Payer: Self-pay

## 2024-12-08 MED ORDER — ARIPIPRAZOLE ER 400 MG IM SRER
400.0000 mg | Freq: Once | INTRAMUSCULAR | Status: AC
  Administered 2024-12-08: 400 mg via INTRAMUSCULAR

## 2024-12-08 MED ORDER — HYDROXYZINE HCL 25 MG PO TABS: 25.0000 mg | ORAL_TABLET | Freq: Three times a day (TID) | ORAL | Status: DC | PRN

## 2024-12-08 MED ORDER — ENSURE PLUS HIGH PROTEIN PO LIQD
237.0000 mL | Freq: Two times a day (BID) | ORAL | Status: DC
  Administered 2024-12-08 – 2024-12-09 (×2): 237 mL via ORAL
  Filled 2024-12-08 (×4): qty 237

## 2024-12-08 NOTE — [facility] Group Notes (Signed)
 Patient did not attend the Recreation Therapy group.

## 2024-12-08 NOTE — BH IP Treatment Plan (Signed)
 Interdisciplinary Treatment and Diagnostic Plan Update  12/08/2024 Time of Session: THIS IS AN UPDATE  Omar Owen MRN: [id number]  Principal Diagnosis: Schizophrenia [facility])  Secondary Diagnoses: Principal Problem:   Schizophrenia (HCC)   Current Medications:  Current Facility-Administered Medications  Medication Dose Route Frequency Provider Last Rate Last Admin   acetaminophen  (TYLENOL ) tablet 650 mg  650 mg Oral Q6H PRN White, Patrice L, NP       alum & mag hydroxide-simeth (MAALOX/MYLANTA) 200-200-20 MG/5ML suspension 30 mL  30 mL Oral Q4H PRN White, Patrice L, NP       ARIPiprazole  (ABILIFY ) tablet 15 mg  15 mg Oral Daily Bennett, Christal H, NP   15 mg at 12/08/24 0830   haloperidol  (HALDOL ) tablet 5 mg  5 mg Oral TID PRN White, Patrice L, NP       And   diphenhydrAMINE  (BENADRYL ) capsule 50 mg  50 mg Oral TID PRN White, Patrice L, NP       haloperidol  lactate (HALDOL ) injection 5 mg  5 mg Intramuscular TID PRN White, Patrice L, NP       And   diphenhydrAMINE  (BENADRYL ) injection 50 mg  50 mg Intramuscular TID PRN White, Patrice L, NP       And   LORazepam  (ATIVAN ) injection 2 mg  2 mg Intramuscular TID PRN White, Patrice L, NP       haloperidol  lactate (HALDOL ) injection 10 mg  10 mg Intramuscular TID PRN White, Patrice L, NP       And   diphenhydrAMINE  (BENADRYL ) injection 50 mg  50 mg Intramuscular TID PRN White, Patrice L, NP       And   LORazepam  (ATIVAN ) injection 2 mg  2 mg Intramuscular TID PRN White, Patrice L, NP       feeding supplement (ENSURE PLUS HIGH PROTEIN) liquid 237 mL  237 mL Oral BID BM Ntuen, Tina C, FNP   237 mL at 12/08/24 1444   hydrOXYzine  (ATARAX ) tablet 25 mg  25 mg Oral TID PRN Bennett, Christal H, NP       ibuprofen  (ADVIL ) tablet 600 mg  600 mg Oral Q6H PRN Bennett, Christal H, NP   600 mg at 12/07/24 1032   magnesium  hydroxide (MILK OF MAGNESIA) suspension 30 mL  30 mL Oral Daily PRN White, Patrice L, NP       nicotine  polacrilex (NICORETTE )  gum 2 mg  2 mg Oral PRN Pashayan, Alexander S, DO   2 mg at 12/08/24 1445   sertraline  (ZOLOFT ) tablet 50 mg  50 mg Oral Daily White, Patrice L, NP   50 mg at 12/08/24 0830   traZODone  (DESYREL ) tablet 50 mg  50 mg Oral QHS PRN White, Patrice L, NP   50 mg at 12/07/24 2151   Vitamin D  (Ergocalciferol ) (DRISDOL ) 1.25 MG (50000 UNIT) capsule 50,000 Units  50,000 Units Oral Q7 days Blair, Christal H, NP   50,000 Units at 12/06/24 1031   PTA Medications: Medications Prior to Admission  Medication Sig Dispense Refill Last Dose/Taking   hydrOXYzine  (ATARAX ) 25 MG tablet Take 1 tablet (25 mg total) by mouth 3 (three) times daily as needed for anxiety. (Patient not taking: Reported on 05/25/2024) 30 tablet 0    OLANZapine  (ZYPREXA ) 15 MG tablet Take 1 tablet (15 mg total) by mouth at bedtime. (Patient not taking: Reported on 05/25/2024) 30 tablet 0    sertraline  (ZOLOFT ) 50 MG tablet Take 1 tablet (50 mg total) by mouth daily. (Patient not taking:  Reported on 05/25/2024) 30 tablet 0     Patient Stressors: Marital or family conflict   Medication change or noncompliance    Patient Strengths: Supportive family/friends   Treatment Modalities: Medication Management, Group therapy, Case management,  1 to 1 session with clinician, Psychoeducation, Recreational therapy.   Physician Treatment Plan for Primary Diagnosis: Schizophrenia (HCC) Long Term Goal(s): Improvement in symptoms so as ready for discharge   Short Term Goals: Ability to identify changes in lifestyle to reduce recurrence of condition will improve Ability to verbalize feelings will improve Ability to disclose and discuss suicidal ideas Ability to demonstrate self-control will improve Ability to identify and develop effective coping behaviors will improve Ability to maintain clinical measurements within normal limits will improve Compliance with prescribed medications will improve Ability to identify triggers associated with substance  abuse/mental health issues will improve  Medication Management: Evaluate patient's response, side effects, and tolerance of medication regimen.  Therapeutic Interventions: 1 to 1 sessions, Unit Group sessions and Medication administration.  Evaluation of Outcomes: Progressing  Physician Treatment Plan for Secondary Diagnosis: Principal Problem:   Schizophrenia (HCC)  Long Term Goal(s): Improvement in symptoms so as ready for discharge   Short Term Goals: Ability to identify changes in lifestyle to reduce recurrence of condition will improve Ability to verbalize feelings will improve Ability to disclose and discuss suicidal ideas Ability to demonstrate self-control will improve Ability to identify and develop effective coping behaviors will improve Ability to maintain clinical measurements within normal limits will improve Compliance with prescribed medications will improve Ability to identify triggers associated with substance abuse/mental health issues will improve     Medication Management: Evaluate patient's response, side effects, and tolerance of medication regimen.  Therapeutic Interventions: 1 to 1 sessions, Unit Group sessions and Medication administration.  Evaluation of Outcomes: Progressing   RN Treatment Plan for Primary Diagnosis: Schizophrenia (HCC) Long Term Goal(s): Knowledge of disease and therapeutic regimen to maintain health will improve  Short Term Goals: Ability to verbalize frustration and anger appropriately will improve, Ability to participate in decision making will improve, Ability to verbalize feelings will improve, and Ability to identify and develop effective coping behaviors will improve  Medication Management: RN will administer medications as ordered by provider, will assess and evaluate patient's response and provide education to patient for prescribed medication. RN will report any adverse and/or side effects to prescribing provider.  Therapeutic  Interventions: 1 on 1 counseling sessions, Psychoeducation, Medication administration, Evaluate responses to treatment, Monitor vital signs and CBGs as ordered, Perform/monitor CIWA, COWS, AIMS and Fall Risk screenings as ordered, Perform wound care treatments as ordered.  Evaluation of Outcomes: Progressing   LCSW Treatment Plan for Primary Diagnosis: Schizophrenia (HCC) Long Term Goal(s): Safe transition to appropriate next level of care at discharge, Engage patient in therapeutic group addressing interpersonal concerns.  Short Term Goals: Engage patient in aftercare planning with referrals and resources, Increase ability to appropriately verbalize feelings, Facilitate acceptance of mental health diagnosis and concerns, and Increase skills for wellness and recovery  Therapeutic Interventions: Assess for all discharge needs, 1 to 1 time with Social worker, Explore available resources and support systems, Assess for adequacy in community support network, Educate family and significant other(s) on suicide prevention, Complete Psychosocial Assessment, Interpersonal group therapy.  Evaluation of Outcomes: Progressing   Progress in Treatment: Attending groups: No. Participating in groups: No. Taking medication as prescribed: Yes. Toleration medication: Yes. Family/Significant other contact made: Yes, contacted: Mother, Shelley Pooley [phone]  Patient understands diagnosis: Yes.  Discussing patient identified problems/goals with staff: Yes. Medical problems stabilized or resolved: Yes. Denies suicidal/homicidal ideation: Yes. Issues/concerns per patient self-inventory: No.   New problem(s) identified:  No   New Short Term/Long Term Goal(s):     medication stabilization, elimination of SI thoughts, development of comprehensive mental wellness plan.      Patient Goals:  I want to work on my discharge.   Discharge Plan or Barriers:  Patient recently admitted. CSW will continue to  follow and assess for appropriate referrals and possible discharge planning.      Reason for Continuation of Hospitalization: Depression Mania Medication stabilization   Estimated Length of Stay:  2-3 days  Last 3 [place] Suicide Severity Risk Score: Flowsheet Row [facility] (Current) [facility]01[facility]2024 [facility] from 08/12/2024 in [facility] RISK CATEGORY No Risk No Risk No Risk    Last PHQ 2/9 Scores:     No data to display          Scribe for Treatment Team: Jenkins LULLA Primer, LCSWA 12/08/2024 3:21 PM

## 2024-12-08 NOTE — Progress Notes (Signed)
 Spiritual care group facilitated by Chaplain Rockie Sofia, [facility]  Group focused on topic of strength. Group members reflected on what thoughts and feelings emerge when they hear this topic. They then engaged in facilitated dialog around how strength is present in their lives. This dialog focused on representing what strength had been to them in their lives (images and patterns given) and what they saw as helpful in their life now (what they needed / wanted).  Activity drew on narrative framework.  Patient Progress: Pt attended group and actively engaged and participated in group conversation and activities. He shared that his strength comes from becoming a dad and wanting to be there for his baby.

## 2024-12-08 NOTE — Group Note (Signed)
 Date:  12/08/2024 Time:  10:00 AM  Group Topic/Focus:  Goals Group:   The focus of this group is to help patients establish daily goals to achieve during treatment and discuss how the patient can incorporate goal setting into their daily lives to aide in recovery.    Participation Level:  Did Not Attend  Participation Quality:  Did Not Attend  Affect:  Did Not Attend  Cognitive:  Did Not Attend  Insight: None  Engagement in Group:  Did Not Attend  Modes of Intervention:  Did Not Attend  Additional Comments:  Did Not Attend  Omar Owen 12/08/2024, 10:00 AM

## 2024-12-08 NOTE — BHH Group Notes (Signed)
Noar did not attend wrap up group.

## 2024-12-08 NOTE — Progress Notes (Signed)
 Beverley Pepper   Type of Note: Collateral with pt's mother  Pt's mother called requesting to speak with social work Conservation Officer, [employer] Build[employer] [phone]).   Mom reports pt talked with her and his father last night on the phone and was completely on another planet. Pt was talking about having to sacrifice a baby to God she does not feel he is in the right mind to be discharged tomorrow. States that based on his current presentation, if he is discharged, she will have him re-IVC'd.   Mother would like a call from MD this afternoon regarding this and his medications. MD made aware.  Signed:  Mitzy Naron, LCSW-A 12/08/2024  1:11 PM

## 2024-12-08 NOTE — Group Note (Signed)
 Recreation Therapy Group Note   Group Topic:Leisure Education  Group Date: 12/08/2024 Start Time: 0930 End Time: 1020 Facilitators: Tyjai Charbonnet-McCall, LRT,CTRS Location: [address] Dayroom   Group Topic: Leisure Education  Goal Area(s) Addresses:  Patient will identify positive leisure activities for use post discharge. Patient will identify at least one positive benefit of participation in leisure activities.   Behavioral Response:    Intervention: Innovation, Individual Creativity, Construction Paper, Markers   Activity: Patients were asked to create their own community program for the community. Patients were to come up with a name for their program, audience program is geared towards, where program will be conducted, hours of operation and what are the benefits of the program.  Education:  Leisure Scientist, Physiological, Communication, Teamwork, Discharge Planning  Education Outcome: Acknowledges education/In group clarification offered/Needs additional education.    Affect/Mood: N/A   Participation Level: Did not attend    Clinical Observations/Individualized Feedback:     Plan: Continue to engage patient in RT group sessions 2-3x/week.   Tynesha Free-McCall, LRT,CTRS 12/08/2024 12:44 PM

## 2024-12-08 NOTE — Progress Notes (Signed)
°   12/08/24 0900  Psych Admission Type (Psych Patients Only)  Admission Status Involuntary  Psychosocial Assessment  Patient Complaints None  Eye Contact Fair  Facial Expression Anxious  Affect Appropriate to circumstance  Speech Logical/coherent  Interaction Minimal  Motor Activity Other (Comment) (WNL)  Appearance/Hygiene Unremarkable  Behavior Characteristics Cooperative  Mood Anxious;Pleasant  Thought Process  Coherency WDL  Content WDL  Delusions None reported or observed  Perception WDL  Hallucination None reported or observed  Judgment Poor  Confusion None  Danger to Self  Current suicidal ideation? Denies  Agreement Not to Harm Self Yes  Description of Agreement verbal  Danger to Others  Danger to Others None reported or observed

## 2024-12-08 NOTE — Progress Notes (Addendum)
 [facility] MD Progress Note  12/08/2024 12:44 PM Chantz Montefusco  MRN:  [id number]  Principal Problem: Schizophrenia (HCC) Diagnosis: Principal Problem:   Schizophrenia (HCC)  Reason for Admission:  Omar Owen is a 23 yo male w/ hx of MDD, schizophrenia, substance induced psychosis, ADHD admitted under IVC from [facility] due to worsening psychosis in setting of medication nonadherence (admitted on 12/02/2024, total  LOS: 6 days ).  Today's assessment notes:   On assessment today, the pt reports that their mood is euthymic, improved since admission, and stable. Denies feeling down, depressed, or sad. Omar Owen reports he will be a proud father in 3 months, as his girlfriend is currently 6 months pregnant.  Patient beams with a smile as he reports this to the provider.  Congratulated patient regarding this information.  Compliant with psychotropic medications without any side effects.  Abilify  Maintenna 400 mg IM q. monthly administered today.  Will monitor patient for any side effects, and if stability maintained, patient would be discharged tomorrow 12/09/2024.  As per pharmacy staff, patient experienced hiccups during therapeutic milieu.  However, none experienced since assessment.  Reports decreased appetite x 2 days, vanilla Ensure p.o. ordered twice daily between meals for appetite stimulation.  Denies SI, HI, or AVH. Reports that anxiety symptoms are at manageable level.  Sleep is stable. Appetite is stable.  Concentration is without complaint.  Energy level is adequate. Denies having any suicidal thoughts. Denies having any suicidal intent and plan.  Denies having any HI.  Denies having psychotic symptoms.   Denies having side effects to current psychiatric medications.   Discussed discharge planning:  How to identify the signs of impending crisis, use of internal coping strategies, reaching out to friends and family that can help navigate a crisis, and a list of mental health  professionals and agencies to call. Further to follow up on her mental health appointments and her PCP appointments.   Collateral Information: Spoke with Patient's mother Omar Owen at [phone] regarding patient discharge tomorrow.  Mother states that family is very supportive of patient, and she would pick patient up from [facility] tomorrow.  Reports her only concern is that the social workers need to contact Omar Owen's ACT Team so as to continue with his Abilify  LAI Q. Monthly.  ASSESSMENT: 12/06/24: Omar remains compliant with his medication regimen. Recent labs were reviewed: A1c is slightly low at 4.5, and vitamin D  is low at 15.11; vitamin D  50,000 units weekly will be initiated. BMP is unremarkable. Insight and judgment show some improvement. He consents to the LAI to support future medication adherence. The case was reviewed with the attending psychiatrist, and the plan is to cross-taper from Zyprexa  to Abilify . Abilify  [name] has been scheduled for administration on Wednesday, with anticipated discharge on Thursday, December 11, pending continued stability.   12/07/24: Omar continues to tolerate his medications well, including the cross-taper from Zyprexa  to Abilify , and does not attribute his headache to medication changes. He is scheduled to receive his long-acting Abilify  injection on December 10, with anticipated discharge on December 11. Although ACT team services could be beneficial, he does not currently qualify due to insurance limitations per social work. Headache appears to be his primary physical complaint today, and ibuprofen  will be ordered as needed for symptom relief. The case was reviewed with the attending psychiatrist, and the current psychiatric treatment plan will be continued.  PLAN: #Schizophrenia #Hx of MDD #Hx of substance induced psychosis - Continue Abilify  15 mg oral daily starting  12/07/24 - Continue Abilify  Maintena injection 400 mg Q 28 days, Next dose due  01/06/24 - Discontinue olanzapine   - hydroxyzine  25 mg tid prn for anxiety - sertraline  50 mg daily - trazodone  50 mg at bedtime prn for insomnia  Disposition Planning: -- Estimated LOS: 5-7 days -- Estimated Discharge Date 12/09/24 -- Barriers to Discharge: acutely psychotic -- Discharge Goals: Return home with outpatient referrals for mental health follow-up including medication management/psychotherapy  Subjective: Omar Owen was seen in his room during rounds. He reports that his mood is OK today. He denies suicidal or homicidal ideation, intent, or plan, and denies auditory or visual hallucinations as well as paranoia. He states that his sleep was OK overall, though he experienced some tossing and turning due to his roommate being sick. He denies any side effects from his current psychiatric medications. He reports having a headache and states he prefers Motrin  rather than Tylenol  for relief. He attended group this morning but left early because of the headache. Omar Owen appears calm, is dressed in casual clothing, and engages appropriately in conversation. He shares that he plans to get another tattoo after discharge.  Objective: Chart Review from last 24 hours:  The patient's chart was reviewed and nursing notes were reviewed. The patient's case was discussed in multidisciplinary team meeting.  - Overnight events to report per chart review / staff report: none - Patient took all prescribed medications yes - Patient received the following PRNs: nicotine  gum  Current Medications: Current Facility-Administered Medications  Medication Dose Route Frequency Provider Last Rate Last Admin   acetaminophen  (TYLENOL ) tablet 650 mg  650 mg Oral Q6H PRN White, Patrice L, NP       alum & mag hydroxide-simeth (MAALOX/MYLANTA) 200-200-20 MG/5ML suspension 30 mL  30 mL Oral Q4H PRN White, Patrice L, NP       ARIPiprazole  (ABILIFY ) tablet 15 mg  15 mg Oral Daily Bennett, Christal H, NP   15 mg at 12/08/24  0830   haloperidol  (HALDOL ) tablet 5 mg  5 mg Oral TID PRN White, Patrice L, NP       And   diphenhydrAMINE  (BENADRYL ) capsule 50 mg  50 mg Oral TID PRN White, Patrice L, NP       haloperidol  lactate (HALDOL ) injection 5 mg  5 mg Intramuscular TID PRN White, Patrice L, NP       And   diphenhydrAMINE  (BENADRYL ) injection 50 mg  50 mg Intramuscular TID PRN White, Patrice L, NP       And   LORazepam  (ATIVAN ) injection 2 mg  2 mg Intramuscular TID PRN White, Patrice L, NP       haloperidol  lactate (HALDOL ) injection 10 mg  10 mg Intramuscular TID PRN White, Patrice L, NP       And   diphenhydrAMINE  (BENADRYL ) injection 50 mg  50 mg Intramuscular TID PRN White, Patrice L, NP       And   LORazepam  (ATIVAN ) injection 2 mg  2 mg Intramuscular TID PRN White, Patrice L, NP       hydrOXYzine  (ATARAX ) tablet 25 mg  25 mg Oral TID PRN Bennett, Christal H, NP       ibuprofen  (ADVIL ) tablet 600 mg  600 mg Oral Q6H PRN Bennett, Christal H, NP   600 mg at 12/07/24 1032   magnesium  hydroxide (MILK OF MAGNESIA) suspension 30 mL  30 mL Oral Daily PRN White, Patrice L, NP       nicotine  polacrilex (NICORETTE ) gum 2 mg  2 mg Oral PRN Pashayan, Alexander S, DO   2 mg at 12/08/24 1113   sertraline  (ZOLOFT ) tablet 50 mg  50 mg Oral Daily White, Patrice L, NP   50 mg at 12/08/24 0830   traZODone  (DESYREL ) tablet 50 mg  50 mg Oral QHS PRN White, Patrice L, NP   50 mg at 12/07/24 2151   Vitamin D  (Ergocalciferol ) (DRISDOL ) 1.25 MG (50000 UNIT) capsule 50,000 Units  50,000 Units Oral Q7 days Bennett, Christal H, NP   50,000 Units at 12/06/24 1031    Lab Results:  No results found for this or any previous visit (from the past 48 hours).  Blood Alcohol level:  Lab Results  Component Value Date   [facility] <15 12/01/2024   ETH <15 05/25/2024   Metabolic Labs: Lab Results  Component Value Date   HGBA1C 4.5 (L) 12/05/2024   MPG 82.45 12/05/2024   MPG 97 12/03/2024   No results found for: PROLACTIN Lab Results   Component Value Date   CHOL 139 12/03/2024   TRIG 57 12/03/2024   HDL 42 12/03/2024   CHOLHDL 3.3 12/03/2024   VLDL 11 12/03/2024   LDLCALC 86 12/03/2024   LDLCALC 83 10/29/2023   Physical Findings: AIMS: No  CIWA:    COWS:     Psychiatric Specialty Exam: General Appearance: Appropriate for Environment; Casual   Eye Contact: Fair   Speech: Clear and Coherent   Volume: Normal   Mood: Euthymic   Affect: Congruent   Thought Content: Logical   Suicidal Thoughts: Denies   Homicidal Thoughts: Denies   Thought Process: Coherent   Orientation: Full (Time, Place and Person)     Memory: Immediate Fair; Recent Fair   Judgment: Fair   Insight: Fair   Concentration: Good   Recall: Fair   Fund of Knowledge: Fair   Language: Good   Psychomotor Activity: fair   Assets: Communication Skills; Desire for Improvement; Resilience; Physical Health; Social Support   Sleep: Sleep: Good Number of Hours of Sleep: 7     Review of Systems Review of Systems  Constitutional:  Negative for chills and fever.  HENT:  Negative for sore throat.   Eyes:  Negative for blurred vision.  Respiratory:  Negative for cough, sputum production, shortness of breath and wheezing.   Cardiovascular:  Negative for chest pain.  Gastrointestinal:  Negative for abdominal pain, constipation, diarrhea, heartburn, nausea and vomiting.  Genitourinary:  Negative for dysuria.  Musculoskeletal:  Negative for falls.  Skin:  Negative for itching and rash.  Neurological:  Negative for dizziness and headaches.  Endo/Heme/Allergies: Negative.   Psychiatric/Behavioral:  Negative for depression, hallucinations, substance abuse and suicidal ideas. The patient is not nervous/anxious.    Vital Signs: Blood pressure 110/79, pulse 64, temperature (!) 97.4 F (36.3 C), temperature source Oral, resp. rate 18, height 6' (1.829 m), weight 61.7 kg, SpO2 100%. Body mass index is 18.44 kg/m.  Physical  Exam Vitals and nursing note reviewed.  Constitutional:      General: He is not in acute distress.    Appearance: He is normal weight. He is not toxic-appearing.  HENT:     Head: Normocephalic.     Right Ear: External ear normal.     Left Ear: External ear normal.     Nose: Nose normal.     Mouth/Throat:     Mouth: Mucous membranes are moist.     Pharynx: Oropharynx is clear.  Eyes:     Extraocular Movements: Extraocular  movements intact.  Cardiovascular:     Rate and Rhythm: Normal rate.     Pulses: Normal pulses.  Pulmonary:     Effort: Pulmonary effort is normal. No respiratory distress.  Musculoskeletal:     Cervical back: Normal range of motion.  Skin:    General: Skin is dry.  Neurological:     General: No focal deficit present.     Mental Status: He is oriented to person, place, and time.  Psychiatric:        Mood and Affect: Mood normal.        Behavior: Behavior normal.        Thought Content: Thought content normal.    I certify that inpatient services furnished can reasonably be expected to improve the patient's condition.   Signed: Ellouise JAYSON Azure, FNP 12/08/2024, 12:44 PMPatient ID: Omar Owen, male   DOB: Mar 20, 2001, 23 y.o.   MRN: [id number] Patient ID: Mikhi Athey, male   DOB: 2001-10-20, 23 y.o.   MRN: [id number] Patient ID: Marcellino Fidalgo, male   DOB: [birth year], 23 y.o.   MRN: [id number]

## 2024-12-08 NOTE — Group Note (Signed)
 Date:  12/08/2024 Time:  8:40 PM  Group Topic/Focus: Gut flora Self Care:   The focus of this group is to help patients understand the importance of self-care in order to improve or restore emotional, physical, spiritual, interpersonal, and financial health.      Participation Level:  Did Not Attend   Omar Owen 12/08/2024, 8:40 PM

## 2024-12-08 NOTE — Plan of Care (Signed)

## 2024-12-08 NOTE — Group Note (Signed)
 Date:  12/08/2024 Time:  2:08 PM  Group Topic/Focus:  Spirituality:   The focus of this group is to discuss how one's spirituality can aide in recovery.    Participation Level:  Did Not Attend  Omar Owen Molly 12/08/2024, 2:08 PM

## 2024-12-08 NOTE — Group Note (Signed)
 Date:  12/08/2024 Time:  8:04 PM  Group Topic/Focus:  Wrap-Up Group:   The focus of this group is to help patients review their daily goal of treatment and discuss progress on daily workbooks.    Participation Level:  Did Not Attend  Participation Quality:  none  Affect:  n/a  Cognitive:  n/a  Insight: None  Engagement in Group:  None  Modes of Intervention:  none  Additional Comments:   Pt did not attend NA meeting  Jisselle Poth A Allexis Bordenave 12/08/2024, 8:04 PM

## 2024-12-09 MED ORDER — VITAMIN D (ERGOCALCIFEROL) 1.25 MG (50000 UNIT) PO CAPS: 50000.0000 [IU] | ORAL_CAPSULE | ORAL | 0 refills | Status: AC

## 2024-12-09 MED ORDER — TRAZODONE HCL 50 MG PO TABS: 50.0000 mg | ORAL_TABLET | Freq: Every evening | ORAL | 0 refills | Status: AC | PRN

## 2024-12-09 MED ORDER — ARIPIPRAZOLE 15 MG PO TABS: 15.0000 mg | ORAL_TABLET | Freq: Every day | ORAL | 0 refills | Status: AC

## 2024-12-09 MED ORDER — IBUPROFEN 600 MG PO TABS: 600.0000 mg | ORAL_TABLET | Freq: Four times a day (QID) | ORAL | 0 refills | Status: AC | PRN

## 2024-12-09 MED ORDER — NICOTINE POLACRILEX 2 MG MT GUM: 2.0000 mg | CHEWING_GUM | OROMUCOSAL | 0 refills | Status: AC | PRN

## 2024-12-09 NOTE — Group Note (Signed)
 Date:  12/09/2024 Time:  9:38 AM  Group Topic/Focus:  Goals Group:   The focus of this group is to help patients establish daily goals to achieve during treatment and discuss how the patient can incorporate goal setting into their daily lives to aide in recovery. Orientation:   The focus of this group is to educate the patient on the purpose and policies of crisis stabilization and provide a format to answer questions about their admission.  The group details unit policies and expectations of patients while admitted.    Participation Level:  Active  Participation Quality:  Appropriate  Affect:  Flat  Cognitive:  Appropriate  Insight: Appropriate  Engagement in Group:  Limited  Modes of Intervention:  Activity  Additional Comments:  N/A  Omar Owen 12/09/2024, 9:38 AM

## 2024-12-09 NOTE — Plan of Care (Signed)
   Problem: Education: Goal: Knowledge of [facility] Education information/materials will improve Outcome: Progressing Goal: Emotional status will improve Outcome: Progressing Goal: Mental status will improve Outcome: Progressing Goal: Verbalization of understanding the information provided will improve Outcome: Progressing

## 2024-12-09 NOTE — Progress Notes (Signed)
 Patient discharged off unit at 1130. Patient belongings reviewed and acknowledged by patient. AVS and Transition Record reviewed and acknowledged by patient. Safety plan completed by patient, reviewed by nurse with patient and copy provided. Any medications and or prescriptions necessary for discharge addressed and provided to patient. Patient denies SI, plan or intent. Denies HI. Denies AVH. No observed or reported side effects to medication. No observed or reported agitation, aggression, or other acute emotional distress. No reported or observed physical abnormalities or concerns. Patient transportation from facility verified and observed.

## 2024-12-09 NOTE — Group Note (Signed)
 Date:  12/09/2024 Time:  10:10 AM  Group Topic/Focus: Nutrition Group  Pt did attend nutrition group   Jaelynn Pozo R Trinidi Toppins 12/09/2024, 10:10 AM

## 2024-12-09 NOTE — Progress Notes (Signed)
°  [facility] Adult Case Management Discharge Plan :  Will you be returning to the same living situation after discharge:  Yes,  pt returning home at discharge with family  At discharge, do you have transportation home?: Yes,  pt will be picked up by mother at 11AM Do you have the ability to pay for your medications: Yes,  pt has active health insurance coverage  Release of information consent forms completed and in the chart;  Patient's signature needed at discharge.  Patient to Follow up at:  Follow-up Information     [facility]. Go on 12/13/2024.   Why: You have a hospital follow up appointment on Monday 12/13/24 at 10AM, in person. Please bring a photo ID, your social security card and insurance card.  You will receive an updated assessment to get back on their ACT team at this appointment. Contact information: [address] [place] [address] [phone]                 Next level of care provider has access to [facility] Link:no  Safety Planning and Suicide Prevention discussed: Yes,  Mother, Lander Eslick [phone]     Has patient been referred to the Quitline?: Patient does not use tobacco/nicotine  products. Pt is a former smoker   Patient has been referred for addiction treatment: No known substance use disorder.  Jenkins LULLA Primer, LCSWA 12/09/2024, 11:00 AM

## 2024-12-09 NOTE — Progress Notes (Signed)
(  Sleep Hours) - 8 hrs (Any PRNs that were needed, meds refused, or side effects to meds)- none (Any disturbances and when (visitation, over night)- none (Concerns raised by the patient)- none (SI/HI/AVH)- denies

## 2024-12-09 NOTE — [facility] Suicide Risk Assessment (Signed)
 Suicide Risk Assessment  Discharge Assessment    [facility] Discharge Suicide Risk Assessment   Principal Problem: Schizophrenia [facility]) Discharge Diagnoses: Principal Problem:   Schizophrenia (HCC)  Reason for admission:  This is one of the several inpatient psychiatric admission for this 23 year old African-American male.  Last hospitalization at [facility] 10/28/2023 and multiple ED visits to [facility] health ED at [facility].  Everrett Lacasse is a 23 year old African-American male with prior psychiatric diagnoses significant for schizophrenia, psychoactive induced psychosis, psychosis, and MDD recurrent severe episodes.  Patient present involuntarily to [facility] from [facility] health ED at [facility] for worsening psychosis, erratic behavior and attempting to harm the parents in the context of not taking his psychotropic medication for 3 to 4 days.  UDS negative for any substances, BAL less than 15.   Total Time spent with patient: 45 minutes  Musculoskeletal: Strength & Muscle Tone: within normal limits Gait & Station: normal Patient leans: N/A  Psychiatric Specialty Exam  Presentation  General Appearance:  Appropriate for Environment; Casual  Eye Contact: Fair  Speech: Clear and Coherent  Speech Volume: Normal  Handedness: Right  Mood and Affect  Mood: Euthymic  Duration of Depression Symptoms: Greater than two weeks  Affect: Congruent  Thought Process  Thought Processes: Coherent  Descriptions of Associations:Intact  Orientation:Full (Time, Place and Person)  Thought Content:Logical  History of Schizophrenia/Schizoaffective disorder:Yes  Duration of Psychotic Symptoms:Greater than six months  Hallucinations:Hallucinations: None  Ideas of Reference:None  Suicidal Thoughts:Suicidal Thoughts: No  Homicidal Thoughts:Homicidal Thoughts: No  Sensorium  Memory: Immediate Fair; Recent  Fair  Judgment: Fair  Insight: Fair  Executive Functions  Concentration: Good  Attention Span: Good  Recall: Fair  Fund of Knowledge: Fair  Language: Good  Psychomotor Activity  Psychomotor Activity: Psychomotor Activity: Normal  Assets  Assets: Communication Skills; Desire for Improvement; Resilience; Physical Health; Social Support  Sleep  Sleep: Sleep: Good  Estimated Sleeping Duration (Last 24 Hours): 6.25-7.75 hours  Physical Exam: Physical Exam Vitals and nursing note reviewed.  Constitutional:      General: He is not in acute distress.    Appearance: He is normal weight. He is not ill-appearing.  HENT:     Head: Normocephalic.     Right Ear: External ear normal.     Left Ear: External ear normal.     Mouth/Throat:     Mouth: Mucous membranes are moist.     Pharynx: Oropharynx is clear.  Eyes:     Extraocular Movements: Extraocular movements intact.  Cardiovascular:     Rate and Rhythm: Normal rate.     Pulses: Normal pulses.  Pulmonary:     Effort: Pulmonary effort is normal. No respiratory distress.  Musculoskeletal:        General: Normal range of motion.  Skin:    General: Skin is dry.  Neurological:     General: No focal deficit present.     Mental Status: He is alert and oriented to person, place, and time.  Psychiatric:        Mood and Affect: Mood normal.        Behavior: Behavior normal.        Thought Content: Thought content normal.    Review of Systems  Constitutional:  Negative for chills and fever.  HENT:  Negative for sore throat.   Eyes:  Negative for blurred vision.  Respiratory:  Negative for cough, sputum production, shortness of breath and wheezing.   Cardiovascular:  Negative  for chest pain and palpitations.  Gastrointestinal:  Negative for abdominal pain, constipation, diarrhea, heartburn, nausea and vomiting.  Genitourinary:  Negative for dysuria.  Musculoskeletal:  Negative for falls.  Skin:  Negative for  itching and rash.  Neurological:  Negative for dizziness and headaches.  Endo/Heme/Allergies: Negative.   Psychiatric/Behavioral:  Positive for depression (Stable with medication). Negative for hallucinations, substance abuse and suicidal ideas. The patient is nervous/anxious (Improved with medication). The patient does not have insomnia.    Blood pressure 116/71, pulse 67, temperature (!) 97.5 F (36.4 C), temperature source Oral, resp. rate 18, height 6' (1.829 m), weight 61.7 kg, SpO2 94%. Body mass index is 18.44 kg/m.  Mental Status Per Nursing Assessment::   On Admission:  NA  Demographic Factors:  Adolescent or young adult and Unemployed  Loss Factors: Legal issues  Historical Factors: Impulsivity  Risk Reduction Factors:   Sense of responsibility to family, Living with another person, especially a relative, Positive social support, Positive therapeutic relationship, and Positive coping skills or problem solving skills  Continued Clinical Symptoms:  Depression:   Recent sense of peace/wellbeing Schizophrenia:   Less than 36 years old More than one psychiatric diagnosis Previous Psychiatric Diagnoses and Treatments  Cognitive Features That Contribute To Risk:  Polarized thinking    Suicide Risk:  Mild:  Suicidal ideation of limited frequency, intensity, duration, and specificity.  There are no identifiable plans, no associated intent, mild dysphoria and related symptoms, good self-control (both objective and subjective assessment), few other risk factors, and identifiable protective factors, including available and accessible social support.   Follow-up Information     [facility] Follow up on 12/07/2024.   Why: Please call this provider on 12/07/24 at 8:30 am to schedule a hospital follow up appointment for therapy and medication management services. Contact information: [address] [place] [address] [phone]                Plan  Of Care/Follow-up recommendations:  Discharge Recommendations:    The patient is being discharged to home.   Patient is to take his discharge medications as ordered. ?See follow up above.   We recommend that he participates in individual therapy to target uncontrollable agitation and substance abuse.    We recommend that he participates in therapy to target personal conflict, to improve communication skills and conflict resolution skills. Patient is to initiate/implement a contingency based behavioral model to address his behavior.   We recommend that he gets AIMS scale, height, weight, blood pressure, fasting lipid panel, fasting blood sugar in three months from discharge if he's on atypical antipsychotics.    Patient will benefit from monitoring of recurrent suicidal ideation since patient is on antidepressant medication.   The patient should abstain from all illicit substances and alcohol.   If the patient's symptoms worsen or do not continue to improve or if the patient becomes actively suicidal or homicidal then it is recommended that the patient return to the closest hospital emergency room or call 911 for further evaluation and treatment. National Suicide Prevention Lifeline 1800-SUICIDE or (314)740-2263.   Please follow up with your primary medical doctor for all other medical needs.    The patient has been educated on the possible side effects to medications and she/her guardian is to contact a medical professional and inform outpatient provider of any new side effects of medication.   He is to take regular diet and activity as tolerated. ?Will benefit from moderate daily exercise.  Patient and Family was educated about removing/locking any firearms, medications or dangerous products from the home.   Activity:  As tolerated   Diet:  Regular Diet   Ellouise JAYSON Azure, FNP 12/09/2024, 10:26 AM
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
## 2024-12-09 NOTE — Group Note (Signed)
 Date:  12/09/2024 Time:  11:49 AM  Group Topic/Focus: Social Work Group    Pt did not attend social work group  [employer] 12/09/2024, 11:49 AM
